# Patient Record
Sex: Male | Born: 1990 | Race: White | Hispanic: No | Marital: Married | Smoking: Never smoker
Health system: Southern US, Community
[De-identification: ages and names within clinical notes are randomized; demographics above are authoritative.]

## PROBLEM LIST (undated history)

## (undated) DIAGNOSIS — E781 Pure hyperglyceridemia: Secondary | ICD-10-CM

## (undated) DIAGNOSIS — E039 Hypothyroidism, unspecified: Secondary | ICD-10-CM

## (undated) HISTORY — PX: ADENOIDECTOMY: SUR15

## (undated) HISTORY — PX: WISDOM TOOTH EXTRACTION: SHX21

## (undated) HISTORY — PX: UVULECTOMY: SHX2631

## (undated) HISTORY — DX: Pure hyperglyceridemia: E78.1

## (undated) HISTORY — PX: TONSILLECTOMY: SUR1361

## (undated) HISTORY — DX: Hypothyroidism, unspecified: E03.9

---
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
## 2009-07-03 ENCOUNTER — Emergency Department (HOSPITAL_COMMUNITY): Admission: EM | Admit: 2009-07-03 | Discharge: 2009-07-03 | Payer: Self-pay | Admitting: Emergency Medicine

## 2010-11-05 ENCOUNTER — Emergency Department (HOSPITAL_COMMUNITY): Payer: No Typology Code available for payment source

## 2010-11-05 ENCOUNTER — Emergency Department (HOSPITAL_COMMUNITY)
Admission: EM | Admit: 2010-11-05 | Discharge: 2010-11-05 | Disposition: A | Discharge status: Home / Self Care | Payer: No Typology Code available for payment source | Attending: Emergency Medicine | Admitting: Emergency Medicine

## 2010-11-05 DIAGNOSIS — M79609 Pain in unspecified limb: Secondary | ICD-10-CM | POA: Insufficient documentation

## 2010-11-05 DIAGNOSIS — R209 Unspecified disturbances of skin sensation: Secondary | ICD-10-CM | POA: Insufficient documentation

## 2010-11-05 DIAGNOSIS — M542 Cervicalgia: Secondary | ICD-10-CM | POA: Insufficient documentation

## 2010-11-05 DIAGNOSIS — R079 Chest pain, unspecified: Secondary | ICD-10-CM | POA: Insufficient documentation

## 2013-06-30 ENCOUNTER — Emergency Department (HOSPITAL_COMMUNITY)
Admission: EM | Admit: 2013-06-30 | Discharge: 2013-06-30 | Disposition: A | Discharge status: Home / Self Care | Payer: BC Managed Care – PPO | Attending: Emergency Medicine | Admitting: Emergency Medicine

## 2013-06-30 ENCOUNTER — Encounter (HOSPITAL_COMMUNITY): Payer: Self-pay | Admitting: Emergency Medicine

## 2013-06-30 DIAGNOSIS — M549 Dorsalgia, unspecified: Secondary | ICD-10-CM | POA: Insufficient documentation

## 2013-06-30 DIAGNOSIS — Z79899 Other long term (current) drug therapy: Secondary | ICD-10-CM | POA: Insufficient documentation

## 2013-06-30 MED ORDER — OXYCODONE-ACETAMINOPHEN 5-325 MG PO TABS: 2.0000 | ORAL_TABLET | ORAL | Status: DC | PRN

## 2013-06-30 MED ORDER — METHOCARBAMOL 500 MG PO TABS: 1000.0000 mg | ORAL_TABLET | Freq: Three times a day (TID) | ORAL | Status: DC

## 2013-06-30 MED ORDER — NAPROXEN 500 MG PO TABS
500.0000 mg | ORAL_TABLET | Freq: Two times a day (BID) | ORAL | Status: DC
  Administered 2013-06-30: 500 mg via ORAL
  Filled 2013-06-30: qty 1
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
## 2014-08-07 ENCOUNTER — Ambulatory Visit (INDEPENDENT_AMBULATORY_CARE_PROVIDER_SITE_OTHER): Payer: BC Managed Care – PPO | Admitting: Family

## 2014-08-07 ENCOUNTER — Encounter: Payer: Self-pay | Admitting: Family

## 2014-08-07 ENCOUNTER — Other Ambulatory Visit (INDEPENDENT_AMBULATORY_CARE_PROVIDER_SITE_OTHER): Payer: BC Managed Care – PPO

## 2014-08-07 VITALS — BP 138/88 | HR 84 | Temp 97.6°F | Resp 18 | Ht 71.0 in | Wt 223.4 lb

## 2014-08-07 DIAGNOSIS — E7800 Pure hypercholesterolemia, unspecified: Secondary | ICD-10-CM

## 2014-08-07 DIAGNOSIS — R0683 Snoring: Secondary | ICD-10-CM | POA: Insufficient documentation

## 2014-08-07 DIAGNOSIS — E781 Pure hyperglyceridemia: Secondary | ICD-10-CM

## 2014-08-07 HISTORY — DX: Pure hyperglyceridemia: E78.1

## 2014-08-07 LAB — LIPID PANEL
CHOL/HDL RATIO: 9
CHOLESTEROL: 155 mg/dL (ref 0–200)
HDL: 16.6 mg/dL — AB (ref >39.00)
NONHDL: 138.4
TRIGLYCERIDES: 744 mg/dL — AB (ref 0.0–149.0)
VLDL: 148.8 mg/dL — AB (ref 0.0–40.0)

## 2014-08-07 LAB — LDL CHOLESTEROL, DIRECT: LDL DIRECT: 41.8 mg/dL

## 2014-08-07 NOTE — Progress Notes (Signed)
Pre visit review using our clinic review tool, if applicable. No additional management support is needed unless otherwise documented below in the visit note. 

## 2014-08-07 NOTE — Patient Instructions (Addendum)
Thank you for choosing [facility].  Summary/Instructions:  Your prescription(s) have been submitted to your pharmacy. Please take as directed and contact our office if you believe you are having problem(s) with the medication(s).  Please stop by the lab on the basement level of the building for your blood work. Your results will be released to MyChart (or called to you) after review, usually within 72 hours after test completion. If any changes need to be made, you will be notified at that same time.  Sleep Apnea  Sleep apnea is a sleep disorder characterized by abnormal pauses in breathing while you sleep. When your breathing pauses, the level of oxygen in your blood decreases. This causes you to move out of deep sleep and into light sleep. As a result, your quality of sleep is poor, and the system that carries your blood throughout your body (cardiovascular system) experiences stress. If sleep apnea remains untreated, the following conditions can develop:  High blood pressure (hypertension).  Coronary artery disease.  Inability to achieve or maintain an erection (impotence).  Impairment of your thought process (cognitive dysfunction). There are three types of sleep apnea: 1. Obstructive sleep apnea--Pauses in breathing during sleep because of a blocked airway. 2. Central sleep apnea--Pauses in breathing during sleep because the area of the brain that controls your breathing does not send the correct signals to the muscles that control breathing. 3. Mixed sleep apnea--A combination of both obstructive and central sleep apnea. RISK FACTORS The following risk factors can increase your risk of developing sleep apnea:  Being overweight.  Smoking.  Having narrow passages in your nose and throat.  Being of older age.  Being male.  Alcohol use.  Sedative and tranquilizer use.  Ethnicity. Among individuals younger than 35 years, African Americans are at increased risk of sleep  apnea. SYMPTOMS   Difficulty staying asleep.  Daytime sleepiness and fatigue.  Loss of energy.  Irritability.  Loud, heavy snoring.  Morning headaches.  Trouble concentrating.  Forgetfulness.  Decreased interest in sex. DIAGNOSIS  In order to diagnose sleep apnea, your caregiver will perform a physical examination. Your caregiver may suggest that you take a home sleep test. Your caregiver may also recommend that you spend the night in a sleep lab. In the sleep lab, several monitors record information about your heart, lungs, and brain while you sleep. Your leg and arm movements and blood oxygen level are also recorded. TREATMENT The following actions may help to resolve mild sleep apnea:  Sleeping on your side.   Using a decongestant if you have nasal congestion.   Avoiding the use of depressants, including alcohol, sedatives, and narcotics.   Losing weight and modifying your diet if you are overweight. There also are devices and treatments to help open your airway:  Oral appliances. These are custom-made mouthpieces that shift your lower jaw forward and slightly open your bite. This opens your airway.  Devices that create positive airway pressure. This positive pressure "splints" your airway open to help you breathe better during sleep. The following devices create positive airway pressure:  Continuous positive airway pressure (CPAP) device. The CPAP device creates a continuous level of air pressure with an air pump. The air is delivered to your airway through a mask while you sleep. This continuous pressure keeps your airway open.  Nasal expiratory positive airway pressure (EPAP) device. The EPAP device creates positive air pressure as you exhale. The device consists of single-use valves, which are inserted into each nostril and  held in place by adhesive. The valves create very little resistance when you inhale but create much more resistance when you exhale. That  increased resistance creates the positive airway pressure. This positive pressure while you exhale keeps your airway open, making it easier to breath when you inhale again.  Bilevel positive airway pressure (BPAP) device. The BPAP device is used mainly in patients with central sleep apnea. This device is similar to the CPAP device because it also uses an air pump to deliver continuous air pressure through a mask. However, with the BPAP machine, the pressure is set at two different levels. The pressure when you exhale is lower than the pressure when you inhale.  Surgery. Typically, surgery is only done if you cannot comply with less invasive treatments or if the less invasive treatments do not improve your condition. Surgery involves removing excess tissue in your airway to create a wider passage way. Document Released: 07/25/2002 Document Revised: 11/29/2012 Document Reviewed: 12/11/2011 [facility] Patient Information 2015 [employer] [place]. This information is not intended to replace advice given to you by your health care provider. Make sure you discuss any questions you have with your health care provider.

## 2014-08-07 NOTE — Progress Notes (Signed)
   Subjective:    Patient ID: Gerald Watts, male    DOB: 09/23/1990, 23 y.o.   MRN: [id number]  Chief Complaint  Patient presents with  . Establish Care    Discuss cholesterol and snoring   HPI:  Gerald Watts is a 23 y.o. male who presents today to establish care and discuss his cholesterol and snoring.   1) High cholesterol - recently tested by his insurance company and was told the results of his cholesterol was abnormally high. Has never had any previous high cholesterol readings.  2) Snoring - has been told that he has been snoring for about year or two and there have been a couple of occasions that he has woken himself up. Has never done a sleep study. Indicates that he does experience some fatigue and tiredness during the day.   No Known Allergies  No current outpatient prescriptions on file prior to visit.   No current facility-administered medications on file prior to visit.   No past medical history on file.  History   Social History  . Marital Status: Single    Spouse Name: N/A    Number of Children: N/A  . Years of Education: 16   Occupational History  . Property Manager    Social History Main Topics  . Smoking status: Never Smoker   . Smokeless tobacco: Never Used  . Alcohol Use: Yes     Comment: Occasional  . Drug Use: No  . Sexual Activity: Not on file   Other Topics Concern  . Not on file   Social History Narrative   [place] Currently resides in an apartment with roommate. 2 dogs. Fun: Sleep, play violin, would like to skydive   Getting married in May 2016   Denies any religious beliefs that would effect healthcare.     Review of Systems    See HPI   Objective:    BP 138/88 mmHg  Pulse 84  Temp(Src) 97.6 F (36.4 C) (Oral)  Resp 18  Ht 5\' 11"  (1.803 m)  Wt 223 lb 6.4 oz (101.334 kg)  BMI 31.17 kg/m2  SpO2 95%  Nursing note and vital signs reviewed.  Physical Exam  Constitutional: He is oriented to person,  place, and time. He appears well-developed and well-nourished. No distress.  Cardiovascular: Normal rate, regular rhythm, normal heart sounds and intact distal pulses.   Pulmonary/Chest: Effort normal and breath sounds normal.  Neurological: He is alert and oriented to person, place, and time.  Skin: Skin is warm and dry.  Psychiatric: He has a normal mood and affect. His behavior is normal. Judgment and thought content normal.       Assessment & Plan:

## 2014-08-07 NOTE — Assessment & Plan Note (Signed)
Indicates that he has been snoring and has noticed that his energy levels have decreased and he is feeling more fatigued. Refer for nocturnal polysomnograph. Discussed with patient use of nasal strips and losing 5-10% of body weight which may help with the snoring. Follow up pending results.

## 2014-08-07 NOTE — Assessment & Plan Note (Signed)
Cholesterol was previously checked by his insurance company and he was instructed that he needed to see his primary care physician because the results are abnormal. Obtain lipid profile. Plan pending results.

## 2014-08-08 ENCOUNTER — Encounter: Payer: Self-pay | Admitting: Family

## 2014-08-08 ENCOUNTER — Telehealth: Payer: Self-pay | Admitting: Family

## 2014-08-08 NOTE — Telephone Encounter (Signed)
Pt request to get tetanus/tdap shot. Please advise.

## 2014-08-08 NOTE — Telephone Encounter (Signed)
A tetanus shot can be done at any time. And I would wait a couple of weeks, so perhaps in mid-January.

## 2014-08-08 NOTE — Telephone Encounter (Signed)
Pt also would like to know when Gerald Watts want him do get another cholesterol done again since the result was in valid last time. Please advise.

## 2014-08-09 ENCOUNTER — Other Ambulatory Visit: Payer: Self-pay | Admitting: Family

## 2014-08-09 DIAGNOSIS — E781 Pure hyperglyceridemia: Secondary | ICD-10-CM

## 2014-08-09 NOTE — Telephone Encounter (Signed)
Pt inform through my chart

## 2014-08-22 ENCOUNTER — Ambulatory Visit: Payer: BC Managed Care – PPO

## 2014-09-18 ENCOUNTER — Encounter: Payer: Self-pay | Admitting: Family

## 2014-09-18 ENCOUNTER — Other Ambulatory Visit (INDEPENDENT_AMBULATORY_CARE_PROVIDER_SITE_OTHER): Payer: 59

## 2014-09-18 ENCOUNTER — Other Ambulatory Visit: Payer: Self-pay | Admitting: Family

## 2014-09-18 ENCOUNTER — Ambulatory Visit (INDEPENDENT_AMBULATORY_CARE_PROVIDER_SITE_OTHER): Payer: 59 | Admitting: Family

## 2014-09-18 VITALS — BP 134/88 | HR 89 | Temp 98.0°F | Resp 18 | Ht 71.0 in | Wt 231.4 lb

## 2014-09-18 DIAGNOSIS — R7989 Other specified abnormal findings of blood chemistry: Secondary | ICD-10-CM

## 2014-09-18 DIAGNOSIS — Z0000 Encounter for general adult medical examination without abnormal findings: Secondary | ICD-10-CM

## 2014-09-18 DIAGNOSIS — Z23 Encounter for immunization: Secondary | ICD-10-CM

## 2014-09-18 LAB — BASIC METABOLIC PANEL
BUN: 8 mg/dL (ref 6–23)
CO2: 25 mEq/L (ref 19–32)
CREATININE: 0.9 mg/dL (ref 0.40–1.50)
Calcium: 9.8 mg/dL (ref 8.4–10.5)
Chloride: 104 mEq/L (ref 96–112)
GFR: 109.73 mL/min (ref >60.00)
GLUCOSE: 93 mg/dL (ref 70–99)
POTASSIUM: 4.1 meq/L (ref 3.5–5.1)
Sodium: 139 mEq/L (ref 135–145)

## 2014-09-18 LAB — CBC
HCT: 43.7 % (ref 39.0–52.0)
Hemoglobin: 15.6 g/dL (ref 13.0–17.0)
MCHC: 35.8 g/dL (ref 30.0–36.0)
MCV: 89.9 fl (ref 78.0–100.0)
PLATELETS: 239 10*3/uL (ref 150.0–400.0)
RBC: 4.86 Mil/uL (ref 4.22–5.81)
RDW: 12.4 % (ref 11.5–15.5)
WBC: 6.3 10*3/uL (ref 4.0–10.5)

## 2014-09-18 LAB — TSH: TSH: 4.71 u[IU]/mL — ABNORMAL HIGH (ref 0.35–4.50)

## 2014-09-18 MED ORDER — PHENTERMINE HCL 37.5 MG PO TABS: 37.5000 mg | ORAL_TABLET | Freq: Every day | ORAL | Status: DC

## 2014-09-18 NOTE — Progress Notes (Signed)
Pre visit review using our clinic review tool, if applicable. No additional management support is needed unless otherwise documented below in the visit note. 

## 2014-09-18 NOTE — Addendum Note (Signed)
Addended by: Mercer PodWRENN, Idil Maslanka E on: 09/18/2014 02:41 PM   Modules accepted: Orders

## 2014-09-18 NOTE — Assessment & Plan Note (Addendum)
1) Anticipatory Guidance: Discussed importance of wearing a seatbelt while driving and not texting while driving; changing batteries in smoke detector at least once annually; wearing suntan lotion when outside; eating a balanced and moderate diet; getting physical activity at least 30 minutes per day.  2) Immunizations / Screenings / Labs:  Tetanus shot given today. All other immunizations are up-to-date per recommendations. Due for dental exam. All otherscreenings are up-to-date per recommendations. Obtain CBC, BMET, and TSH.    Overall well exam. Discussed risk factors including elevated blood pressure and obesity. Patient indicates a sedentary lifestyle. The goal is to increase his physical activity to 30 minutes most days of the week. He will also work on increasing the nutrient density of his diet as well as starting a Fish supplement for his lower HDL level. He also requests to start phentermine as a trial. Discussed the risks and benefits will start phentermine 1 month. Follow up prevention exam in one year.

## 2014-09-18 NOTE — Progress Notes (Signed)
Subjective:    Patient ID: Gerald Watts, male    DOB: 09/10/1990, 24 y.o.   MRN: [id number]   Chief Complaint  Patient presents with  . CPE    Not fasting    HPI:  Gerald Watts is a 24 y.o. male who presents today for an annual wellness visit.   1) Health Maintenance - Overall feels ok, but feels overweight. Preparing to get married.  Diet - Eating 3 meals per day; more fruits than vegetables; heavy in the carbohydrate area; about 1 cup of caffeine per day; eats a lot of processed food  Exercise - Little to none   2) Preventative Exams / Immunizations:  Dental -- Due for exam Vision -- Up to date   Health Maintenance  Topic Date Due  . TETANUS/TDAP  03/22/2009  . INFLUENZA VACCINE  03/18/2014  Tetanus    There is no immunization history on file for this patient.   No Known Allergies  No current outpatient prescriptions on file prior to visit.   No current facility-administered medications on file prior to visit.    History reviewed. No pertinent past medical history.  Past Surgical History  Procedure Laterality Date  . Wisdom tooth extraction      Family History  Problem Relation Age of Onset  . Drug abuse Mother     Prescription medications  . Hypertension Mother     History   Social History  . Marital Status: Single    Spouse Name: N/A    Number of Children: N/A  . Years of Education: 16   Occupational History  . Property Manager    Social History Main Topics  . Smoking status: Never Smoker   . Smokeless tobacco: Never Used  . Alcohol Use: Yes     Comment: Occasional  . Drug Use: No  . Sexual Activity: Not on file   Other Topics Concern  . Not on file   Social History Narrative   Born and raised [place], [place]. Currently resides in an apartment with roommate. 2 dogs. Fun: Sleep, play violin, would like to skydive   Getting married in May 2016   Denies any religious beliefs that would effect healthcare.     Review of  Systems  Constitutional: Denies fever, chills, fatigue, or significant weight gain/loss. HENT: Head: Denies headache or neck pain Ears: Denies changes in hearing, ringing in ears, earache, drainage Nose: Denies discharge, stuffiness, itching, nosebleed, sinus pain Throat: Denies sore throat, hoarseness, dry mouth, sores, thrush Eyes: Denies loss/changes in vision, pain, redness, blurry/double vision, flashing lights Cardiovascular: Denies chest pain/discomfort, tightness, palpitations, shortness of breath with activity, difficulty lying down, swelling, sudden awakening with shortness of breath Respiratory: Denies shortness of breath, cough, sputum production, wheezing Gastrointestinal: Denies dysphasia, heartburn, change in appetite, nausea, change in bowel habits, rectal bleeding, constipation, diarrhea, yellow skin or eyes Genitourinary: Denies frequency, urgency, burning/pain, blood in urine, incontinence, change in urinary strength. Musculoskeletal: Denies muscle/joint pain, stiffness, back pain, redness or swelling of joints, trauma Skin: Denies rashes, lumps, itching, dryness, color changes, or hair/nail changes Neurological: Denies dizziness, fainting, seizures, weakness, numbness, tingling, tremor Psychiatric - Denies nervousness, stress, depression or memory loss Endocrine: Denies heat or cold intolerance, sweating, frequent urination, excessive thirst, changes in appetite Hematologic: Denies ease of bruising or bleeding     Objective:    BP 134/88 mmHg  Pulse 89  Temp(Src) 98 F (36.7 C) (Oral)  Resp 18  Ht  (1.803 m)  Wt 231 lb 6.4 oz (104.962 kg)  BMI 32.29 kg/m2  SpO2 97% Nursing note and vital signs reviewed.  Physical Exam  Constitutional: He is oriented to person, place, and time. He appears well-developed and well-nourished.  HENT:  Head: Normocephalic.  Right Ear: Hearing, tympanic membrane, external ear and ear canal normal.  Left Ear: Hearing, tympanic  membrane, external ear and ear canal normal.  Nose: Nose normal.  Mouth/Throat: Uvula is midline, oropharynx is clear and moist and mucous membranes are normal.  Eyes: Conjunctivae and EOM are normal. Pupils are equal, round, and reactive to light.  Neck: Neck supple. No JVD present. No tracheal deviation present. No thyromegaly present.  Cardiovascular: Normal rate, regular rhythm, normal heart sounds and intact distal pulses.   Pulmonary/Chest: Effort normal and breath sounds normal.  Abdominal: Soft. Bowel sounds are normal. He exhibits no distension and no mass. There is no tenderness. There is no rebound and no guarding.  Musculoskeletal: Normal range of motion. He exhibits no edema or tenderness.  Lymphadenopathy:    He has no cervical adenopathy.  Neurological: He is alert and oriented to person, place, and time. He has normal reflexes. No cranial nerve deficit. He exhibits normal muscle tone. Coordination normal.  Skin: Skin is warm and dry.  Psychiatric: He has a normal mood and affect. His behavior is normal. Judgment and thought content normal.       Assessment & Plan:

## 2014-09-18 NOTE — Patient Instructions (Addendum)
Thank you for choosing [facility].  Summary/Instructions:  Please stop by the lab on the basement level of the building for your blood work. Your results will be released to MyChart (or called to you) after review, usually within 72 hours after test completion. If any changes need to be made, you will be notified at that same time.  Referrals have been made during this visit. You should expect to hear back from our schedulers in about 7-10 days in regards to establishing an appointment with the specialists we discussed.    Health Maintenance A healthy lifestyle and preventative care can promote health and wellness.  Maintain regular health, dental, and eye exams.  Eat a healthy diet. Foods like vegetables, fruits, whole grains, low-fat dairy products, and lean protein foods contain the nutrients you need and are low in calories. Decrease your intake of foods high in solid fats, added sugars, and salt. Get information about a proper diet from your health care provider, if necessary.  Regular physical exercise is one of the most important things you can do for your health. Most adults should get at least 150 minutes of moderate-intensity exercise (any activity that increases your heart rate and causes you to sweat) each week. In addition, most adults need muscle-strengthening exercises on 2 or more days a week.   Maintain a healthy weight. The body mass index (BMI) is a screening tool to identify possible weight problems. It provides an estimate of body fat based on height and weight. Your health care provider can find your BMI and can help you achieve or maintain a healthy weight. For males 20 years and older:  A BMI below 18.5 is considered underweight.  A BMI of 18.5 to 24.9 is normal.  A BMI of 25 to 29.9 is considered overweight.  A BMI of 30 and above is considered obese.  Maintain normal blood lipids and cholesterol by exercising and minimizing your intake of saturated fat.  Eat a balanced diet with plenty of fruits and vegetables. Blood tests for lipids and cholesterol should begin at age 20 and be repeated every 5 years. If your lipid or cholesterol levels are high, you are over age 50, or you are at high risk for heart disease, you may need your cholesterol levels checked more frequently.Ongoing high lipid and cholesterol levels should be treated with medicines if diet and exercise are not working.  If you smoke, find out from your health care provider how to quit. If you do not use tobacco, do not start.  Lung cancer screening is recommended for adults aged 55-80 years who are at high risk for developing lung cancer because of a history of smoking. A yearly low-dose CT scan of the lungs is recommended for people who have at least a 30-pack-year history of smoking and are current smokers or have quit within the past 15 years. A pack year of smoking is smoking an average of 1 pack of cigarettes a day for 1 year (for example, a 30-pack-year history of smoking could mean smoking 1 pack a day for 30 years or 2 packs a day for 15 years). Yearly screening should continue until the smoker has stopped smoking for at least 15 years. Yearly screening should be stopped for people who develop a health problem that would prevent them from having lung cancer treatment.  If you choose to drink alcohol, do not have more than 2 drinks per day. One drink is considered to be 12 oz (360 mL) of   beer, 5 oz (150 mL) of wine, or 1.5 oz (45 mL) of liquor.  Avoid the use of street drugs. Do not share needles with anyone. Ask for help if you need support or instructions about stopping the use of drugs.  High blood pressure causes heart disease and increases the risk of stroke. Blood pressure should be checked at least every 1-2 years. Ongoing high blood pressure should be treated with medicines if weight loss and exercise are not effective.  If you are 45-79 years old, ask your health care  provider if you should take aspirin to prevent heart disease.  Diabetes screening involves taking a blood sample to check your fasting blood sugar level. This should be done once every 3 years after age 45 if you are at a normal weight and without risk factors for diabetes. Testing should be considered at a younger age or be carried out more frequently if you are overweight and have at least 1 risk factor for diabetes.  Colorectal cancer can be detected and often prevented. Most routine colorectal cancer screening begins at the age of 50 and continues through age 75. However, your health care provider may recommend screening at an earlier age if you have risk factors for colon cancer. On a yearly basis, your health care provider may provide home test kits to check for hidden blood in the stool. A small camera at the end of a tube may be used to directly examine the colon (sigmoidoscopy or colonoscopy) to detect the earliest forms of colorectal cancer. Talk to your health care provider about this at age 50 when routine screening begins. A direct exam of the colon should be repeated every 5-10 years through age 75, unless early forms of precancerous polyps or small growths are found.  People who are at an increased risk for hepatitis B should be screened for this virus. You are considered at high risk for hepatitis B if:  You were born in a country where hepatitis B occurs often. Talk with your health care provider about which countries are considered high risk.  Your parents were born in a high-risk country and you have not received a shot to protect against hepatitis B (hepatitis B vaccine).  You have HIV or AIDS.  You use needles to inject street drugs.  You live with, or have sex with, someone who has hepatitis B.  You are a man who has sex with other men (MSM).  You get hemodialysis treatment.  You take certain medicines for conditions like cancer, organ transplantation, and autoimmune  conditions.  Hepatitis C blood testing is recommended for all people born from 1945 through 1965 and any individual with known risk factors for hepatitis C.  Healthy men should no longer receive prostate-specific antigen (PSA) blood tests as part of routine cancer screening. Talk to your health care provider about prostate cancer screening.  Testicular cancer screening is not recommended for adolescents or adult males who have no symptoms. Screening includes self-exam, a health care provider exam, and other screening tests. Consult with your health care provider about any symptoms you have or any concerns you have about testicular cancer.  Practice safe sex. Use condoms and avoid high-risk sexual practices to reduce the spread of sexually transmitted infections (STIs).  You should be screened for STIs, including gonorrhea and chlamydia if:  You are sexually active and are younger than 24 years.  You are older than 24 years, and your health care provider tells you that you are   at risk for this type of infection.  Your sexual activity has changed since you were last screened, and you are at an increased risk for chlamydia or gonorrhea. Ask your health care provider if you are at risk.  If you are at risk of being infected with HIV, it is recommended that you take a prescription medicine daily to prevent HIV infection. This is called pre-exposure prophylaxis (PrEP). You are considered at risk if:  You are a man who has sex with other men (MSM).  You are a heterosexual man who is sexually active with multiple partners.  You take drugs by injection.  You are sexually active with a partner who has HIV.  Talk with your health care provider about whether you are at high risk of being infected with HIV. If you choose to begin PrEP, you should first be tested for HIV. You should then be tested every 3 months for as long as you are taking PrEP.  Use sunscreen. Apply sunscreen liberally and  repeatedly throughout the day. You should seek shade when your shadow is shorter than you. Protect yourself by wearing long sleeves, pants, a wide-brimmed hat, and sunglasses year round whenever you are outdoors.  Tell your health care provider of new moles or changes in moles, especially if there is a change in shape or color. Also, tell your health care provider if a mole is larger than the size of a pencil eraser.  A one-time screening for abdominal aortic aneurysm (AAA) and surgical repair of large AAAs by ultrasound is recommended for men aged 65-75 years who are current or former smokers.  Stay current with your vaccines (immunizations). Document Released: 01/31/2008 Document Revised: 08/09/2013 Document Reviewed: 12/30/2010 [employer] Patient Information 2015 [employer]. This information is not intended to replace advice given to you by your health care provider. Make sure you discuss any questions you have with your health care provider.   

## 2014-09-20 ENCOUNTER — Encounter: Payer: Self-pay | Admitting: Family

## 2014-09-26 ENCOUNTER — Encounter: Payer: Self-pay | Admitting: Family

## 2014-10-17 ENCOUNTER — Ambulatory Visit: Payer: 59 | Admitting: Family

## 2014-10-27 ENCOUNTER — Ambulatory Visit: Payer: 59 | Admitting: Family

## 2014-10-30 ENCOUNTER — Ambulatory Visit: Payer: 59 | Admitting: Family

## 2014-10-31 ENCOUNTER — Ambulatory Visit (INDEPENDENT_AMBULATORY_CARE_PROVIDER_SITE_OTHER): Payer: BLUE CROSS/BLUE SHIELD | Admitting: Family

## 2014-10-31 ENCOUNTER — Encounter: Payer: Self-pay | Admitting: Family

## 2014-10-31 VITALS — BP 132/90 | HR 99 | Temp 98.3°F | Resp 18 | Ht 71.0 in | Wt 216.8 lb

## 2014-10-31 DIAGNOSIS — R5383 Other fatigue: Secondary | ICD-10-CM | POA: Insufficient documentation

## 2014-10-31 DIAGNOSIS — E669 Obesity, unspecified: Secondary | ICD-10-CM

## 2014-10-31 MED ORDER — PHENTERMINE HCL 37.5 MG PO TABS: 37.5000 mg | ORAL_TABLET | Freq: Every day | ORAL | Status: DC

## 2014-10-31 NOTE — Patient Instructions (Signed)
Thank you for choosing [facility].  Summary/Instructions:  Your prescription(s) have been submitted to your pharmacy or been printed and provided for you. Please take as directed and contact our office if you believe you are having problem(s) with the medication(s) or have any questions.  Please stop by the lab on the basement level of the building for your blood work. Your results will be released to MyChart (or called to you) after review, usually within 72 hours after test completion. If any changes need to be made, you will be notified at that same time.  If your symptoms worsen or fail to improve, please contact our office for further instruction, or in case of emergency go directly to the emergency room at the closest medical facility.     

## 2014-10-31 NOTE — Progress Notes (Signed)
Pre visit review using our clinic review tool, if applicable. No additional management support is needed unless otherwise documented below in the visit note. 

## 2014-10-31 NOTE — Assessment & Plan Note (Signed)
Patient has experience a weight loss of 13 pounds since previous visit. Denies any adverse effects of the phentermine. Would like to continue another month. Refill phentermine at current dosage. Follow-up in one month.

## 2014-10-31 NOTE — Assessment & Plan Note (Signed)
Patient continues to experience idiopathic fatigue. Previous blood work reveals no evidence of anemia, TSH was slightly elevated at 4.7. Obtain testosterone. Cannot rule out cardiovascular disease, anxiety, or depression. Follow-up pending testosterone results.

## 2014-10-31 NOTE — Progress Notes (Signed)
   Subjective:    Patient ID: Gerald Watts, male    DOB: 02/03/1991, 24 y.o.   MRN: [id number]  Chief Complaint  Patient presents with  . Follow-up    needs refill of phentermine, has lost 13lbs in a month, but also states he gets to where he has no energy and feels like he is dragging.     HPI:  Gerald Watts is a 24 y.o. male who presents today for follow up.  Was recently started on phentermine for weight loss and indicates that he has experienced the associated symptom of  13 pounds of weight loss since starting the medication. He is maintaining a mainly vegetarian type diet with occasional meat. Denies any adverse effects of medication.  Does not some continued fatigue.    Wt Readings from Last 3 Encounters:  10/31/14 216 lb 12.8 oz (98.34 kg)  09/18/14 231 lb 6.4 oz (104.962 kg)  08/07/14 223 lb 6.4 oz (101.334 kg)    No Known Allergies   Current Outpatient Prescriptions on File Prior to Visit  Medication Sig Dispense Refill  . phentermine (ADIPEX-P) 37.5 MG tablet Take 1 tablet (37.5 mg total) by mouth daily before breakfast. 30 tablet 0   No current facility-administered medications on file prior to visit.    Review of Systems  Constitutional: Positive for fatigue.  Respiratory: Negative for chest tightness and shortness of breath.   Cardiovascular: Negative for chest pain, palpitations and leg swelling.  Neurological: Negative for headaches.      Objective:    BP 132/90 mmHg  Pulse 99  Temp(Src) 98.3 F (36.8 C) (Oral)  Resp 18  Ht 5\' 11"  (1.803 m)  Wt 216 lb 12.8 oz (98.34 kg)  BMI 30.25 kg/m2  SpO2 97% Nursing note and vital signs reviewed.  Physical Exam  Constitutional: He is oriented to person, place, and time. He appears well-developed and well-nourished. No distress.  Cardiovascular: Normal rate, regular rhythm, normal heart sounds and intact distal pulses.   Pulmonary/Chest: Effort normal and breath sounds normal.  Neurological: He is  alert and oriented to person, place, and time.  Skin: Skin is warm and dry.  Psychiatric: He has a normal mood and affect. His behavior is normal. Judgment and thought content normal.       Assessment & Plan:

## 2014-11-30 ENCOUNTER — Telehealth: Payer: Self-pay | Admitting: Family

## 2014-11-30 DIAGNOSIS — R5383 Other fatigue: Secondary | ICD-10-CM

## 2014-12-05 MED ORDER — PHENTERMINE HCL 37.5 MG PO TABS: 37.5000 mg | ORAL_TABLET | Freq: Every day | ORAL | Status: DC

## 2014-12-05 NOTE — Telephone Encounter (Signed)
Medication refilled

## 2014-12-05 NOTE — Addendum Note (Signed)
Addended by: Jeanine LuzALONE, Yoan Sallade D on: 12/05/2014 01:26 PM   Modules accepted: Orders

## 2014-12-05 NOTE — Telephone Encounter (Addendum)
Medication faxed to pharmacy. LVM for pt

## 2015-03-19 ENCOUNTER — Encounter: Payer: Self-pay | Admitting: Family

## 2015-03-19 ENCOUNTER — Ambulatory Visit (INDEPENDENT_AMBULATORY_CARE_PROVIDER_SITE_OTHER): Payer: BLUE CROSS/BLUE SHIELD | Admitting: Family

## 2015-03-19 ENCOUNTER — Other Ambulatory Visit (INDEPENDENT_AMBULATORY_CARE_PROVIDER_SITE_OTHER): Payer: BLUE CROSS/BLUE SHIELD

## 2015-03-19 VITALS — BP 128/88 | HR 86 | Temp 97.8°F | Resp 18 | Ht 71.0 in | Wt 211.1 lb

## 2015-03-19 DIAGNOSIS — R5383 Other fatigue: Secondary | ICD-10-CM

## 2015-03-19 DIAGNOSIS — E669 Obesity, unspecified: Secondary | ICD-10-CM

## 2015-03-19 DIAGNOSIS — N529 Male erectile dysfunction, unspecified: Secondary | ICD-10-CM | POA: Diagnosis not present

## 2015-03-19 LAB — LIPID PANEL
CHOL/HDL RATIO: 9
CHOLESTEROL: 198 mg/dL (ref 0–200)
HDL: 21 mg/dL — ABNORMAL LOW (ref >39.00)

## 2015-03-19 LAB — TSH: TSH: 4.15 u[IU]/mL (ref 0.35–4.50)

## 2015-03-19 LAB — TESTOSTERONE: TESTOSTERONE: 368.87 ng/dL (ref 300.00–890.00)

## 2015-03-19 LAB — LDL CHOLESTEROL, DIRECT: Direct LDL: 67 mg/dL

## 2015-03-19 LAB — T3: T3, Total: 134.1 ng/dL (ref 80.0–204.0)

## 2015-03-19 LAB — T4: T4 TOTAL: 7.2 ug/dL (ref 4.5–12.0)

## 2015-03-19 MED ORDER — SILDENAFIL CITRATE 50 MG PO TABS: 50.0000 mg | ORAL_TABLET | Freq: Every day | ORAL | Status: DC | PRN

## 2015-03-19 MED ORDER — FENOFIBRATE 145 MG PO TABS: 145.0000 mg | ORAL_TABLET | Freq: Every day | ORAL | Status: DC

## 2015-03-19 MED ORDER — LEVOTHYROXINE SODIUM 25 MCG PO TABS: 25.0000 ug | ORAL_TABLET | Freq: Every day | ORAL | Status: DC

## 2015-03-19 NOTE — Assessment & Plan Note (Signed)
Continues to experience associated symptom of fatigue. Was previously noted to have increased TSH, however never completed the blood work. Obtain TSH, T3, and T4 to recheck thyroid. Obtain lipid profile and testosterone to rule out other metabolic causes. Follow-up and treatment pending lab work.

## 2015-03-19 NOTE — Patient Instructions (Addendum)
Thank you for choosing [facility].  Summary/Instructions:  Your prescription(s) have been submitted to your pharmacy or been printed and provided for you. Please take as directed and contact our office if you believe you are having problem(s) with the medication(s) or have any questions.  If your symptoms worsen or fail to improve, please contact our office for further instruction, or in case of emergency go directly to the emergency room at the closest medical facility.    Fatigue Fatigue is a feeling of tiredness, lack of energy, lack of motivation, or feeling tired all the time. Having enough rest, good nutrition, and reducing stress will normally reduce fatigue. Consult your caregiver if it persists. The nature of your fatigue will help your caregiver to find out its cause. The treatment is based on the cause.  CAUSES  There are many causes for fatigue. Most of the time, fatigue can be traced to one or more of your habits or routines. Most causes fit into one or more of three general areas. They are: Lifestyle problems  Sleep disturbances.  Overwork.  Physical exertion.  Unhealthy habits.  Poor eating habits or eating disorders.  Alcohol and/or drug use .  Lack of proper nutrition (malnutrition). Psychological problems  Stress and/or anxiety problems.  Depression.  Grief.  Boredom. Medical Problems or Conditions  Anemia.  Pregnancy.  Thyroid gland problems.  Recovery from major surgery.  Continuous pain.  Emphysema or asthma that is not well controlled  Allergic conditions.  Diabetes.  Infections (such as mononucleosis).  Obesity.  Sleep disorders, such as sleep apnea.  Heart failure or other heart-related problems.  Cancer.  Kidney disease.  Liver disease.  Effects of certain medicines such as antihistamines, cough and cold remedies, prescription pain medicines, heart and blood pressure medicines, drugs used for treatment of cancer,  and some antidepressants. SYMPTOMS  The symptoms of fatigue include:   Lack of energy.  Lack of drive (motivation).  Drowsiness.  Feeling of indifference to the surroundings. DIAGNOSIS  The details of how you feel help guide your caregiver in finding out what is causing the fatigue. You will be asked about your present and past health condition. It is important to review all medicines that you take, including prescription and non-prescription items. A thorough exam will be done. You will be questioned about your feelings, habits, and normal lifestyle. Your caregiver may suggest blood tests, urine tests, or other tests to look for common medical causes of fatigue.  TREATMENT  Fatigue is treated by correcting the underlying cause. For example, if you have continuous pain or depression, treating these causes will improve how you feel. Similarly, adjusting the dose of certain medicines will help in reducing fatigue.  HOME CARE INSTRUCTIONS   Try to get the required amount of good sleep every night.  Eat a healthy and nutritious diet, and drink enough water throughout the day.  Practice ways of relaxing (including yoga or meditation).  Exercise regularly.  Make plans to change situations that cause stress. Act on those plans so that stresses decrease over time. Keep your work and personal routine reasonable.  Avoid street drugs and minimize use of alcohol.  Start taking a daily multivitamin after consulting your caregiver. SEEK MEDICAL CARE IF:   You have persistent tiredness, which cannot be accounted for.  You have fever.  You have unintentional weight loss.  You have headaches.  You have disturbed sleep throughout the night.  You are feeling sad.  You have constipation.  You  have dry skin.  You have gained weight.  You are taking any new or different medicines that you suspect are causing fatigue.  You are unable to sleep at night.  You develop any unusual swelling  of your legs or other parts of your body. SEEK IMMEDIATE MEDICAL CARE IF:   You are feeling confused.  Your vision is blurred.  You feel faint or pass out.  You develop severe headache.  You develop severe abdominal, pelvic, or back pain.  You develop chest pain, shortness of breath, or an irregular or fast heartbeat.  You are unable to pass a normal amount of urine.  You develop abnormal bleeding such as bleeding from the rectum or you vomit blood.  You have thoughts about harming yourself or committing suicide.  You are worried that you might harm someone else. MAKE SURE YOU:   Understand these instructions.  Will watch your condition.  Will get help right away if you are not doing well or get worse. Document Released: 06/01/2007 Document Revised: 10/27/2011 Document Reviewed: 12/06/2013 [facility] Patient Information 2015 [place], [place]. This information is not intended to replace advice given to you by your health care provider. Make sure you discuss any questions you have with your health care provider.

## 2015-03-19 NOTE — Assessment & Plan Note (Signed)
Symptoms consistent with erectile dysfunction of undetermined source. Start Viagra. Patient advised of risks and benefits of medication. Follow-up pending usage of medication to determine effectiveness.

## 2015-03-19 NOTE — Progress Notes (Signed)
Pre visit review using our clinic review tool, if applicable. No additional management support is needed unless otherwise documented below in the visit note. 

## 2015-03-19 NOTE — Progress Notes (Signed)
   Subjective:    Patient ID: Gerald Watts, male    DOB: [birth year], 24 y.o.   MRN: [id number]  Chief Complaint  Patient presents with  . Follow-up    wants to talk about the phentermine and has some fertility questions    HPI:  Gerald Watts is a 24 y.o. male with a PMH of snoring, fatigue and elevated cholesterol who presents today an office visit.   1.) Trouble with erections - This is a new problem. Associated symptom of difficulty maintaining an erection has been going on for about 1 month. Able to obtain an erection.  Recently completed a course of phentermine which has the side effect of impotenence and lost 25 pounds with the medication. Denies any modifying factors that make it better or worse. Previously evaluated for fatigue and found to have an increase TSH level. Recently found out his wife was weeks pregnant with their first child.   No Known Allergies   No current outpatient prescriptions on file prior to visit.   No current facility-administered medications on file prior to visit.    Review of Systems  Constitutional: Positive for fatigue. Negative for fever and chills.  Respiratory: Negative for chest tightness and shortness of breath.   Cardiovascular: Negative for chest pain, palpitations and leg swelling.      Objective:    BP 128/88 mmHg  Pulse 86  Temp(Src) 97.8 F (36.6 C) (Oral)  Resp 18  Ht  (1.803 m)  Wt 211 lb 1.9 oz (95.763 kg)  BMI 29.46 kg/m2  SpO2 98% Nursing note and vital signs reviewed.  Physical Exam  Constitutional: He is oriented to person, place, and time. He appears well-developed and well-nourished. No distress.  Cardiovascular: Normal rate, regular rhythm, normal heart sounds and intact distal pulses.   Pulmonary/Chest: Effort normal and breath sounds normal.  Neurological: He is alert and oriented to person, place, and time.  Skin: Skin is warm and dry.  Psychiatric: He has a normal mood and affect. His behavior is  normal. Judgment and thought content normal.       Assessment & Plan:   Problem List Items Addressed This Visit      Genitourinary   Erectile dysfunction    Symptoms consistent with erectile dysfunction of undetermined source. Start Viagra. Patient advised of risks and benefits of medication. Follow-up pending usage of medication to determine effectiveness.      Relevant Medications   sildenafil (VIAGRA) 50 MG tablet     Other   Fatigue - Primary    Continues to experience associated symptom of fatigue. Was previously noted to have increased TSH, however never completed the blood work. Obtain TSH, T3, and T4 to recheck thyroid. Obtain lipid profile and testosterone to rule out other metabolic causes. Follow-up and treatment pending lab work.      Relevant Orders   Testosterone   TSH   T3   T4   Lipid Profile

## 2015-03-26 ENCOUNTER — Telehealth: Payer: Self-pay

## 2015-03-26 DIAGNOSIS — N529 Male erectile dysfunction, unspecified: Secondary | ICD-10-CM

## 2015-03-26 NOTE — Telephone Encounter (Signed)
Received pharmacy rejection stating that insurance will not cover viagra without a prior authorization. Called [facility] [place] at [phone], who fill fax form for completion.

## 2015-03-26 NOTE — Telephone Encounter (Signed)
Form received, completed, pending signature to fax back.

## 2015-03-27 NOTE — Telephone Encounter (Signed)
PA was approved, called pharmacy to let them know. Even through insurance viagra is $300. Is there any other suggestions. Please advise

## 2015-03-27 NOTE — Telephone Encounter (Signed)
Only other suggestion is to go through [facility]. This would be  pills and are $10 per 5 pills. Dose range between 50-100 mg daily.

## 2015-03-28 ENCOUNTER — Encounter: Payer: Self-pay | Admitting: Family

## 2015-03-28 MED ORDER — SILDENAFIL CITRATE 50 MG PO TABS: 50.0000 mg | ORAL_TABLET | Freq: Every day | ORAL | Status: DC | PRN

## 2015-03-28 NOTE — Telephone Encounter (Signed)
LVM for pt to call back.

## 2015-03-28 NOTE — Telephone Encounter (Signed)
Patient states he would like to go through [facility].  He would like to do 10 pills @ $20.00 to start out.  Please call patient if there is anything else he needs to know.

## 2015-03-28 NOTE — Addendum Note (Signed)
Addended by: Mercer Pod E on: 03/28/2015 01:37 PM   Modules accepted: Orders

## 2015-03-28 NOTE — Telephone Encounter (Signed)
[facility] called wanting to save patients money would like to have prescription written  #50 and same directions are fine.

## 2015-03-28 NOTE — Telephone Encounter (Signed)
Rx sent. Pt aware.  

## 2015-03-28 NOTE — Addendum Note (Signed)
Addended by: Mercer Pod E on: 03/28/2015 11:42 AM   Modules accepted: Orders

## 2015-03-28 NOTE — Telephone Encounter (Signed)
Rx sent 

## 2015-03-30 ENCOUNTER — Encounter: Payer: Self-pay | Admitting: Family

## 2015-04-19 ENCOUNTER — Ambulatory Visit (INDEPENDENT_AMBULATORY_CARE_PROVIDER_SITE_OTHER): Payer: BLUE CROSS/BLUE SHIELD | Admitting: Family

## 2015-04-19 ENCOUNTER — Encounter: Payer: Self-pay | Admitting: Family

## 2015-04-19 VITALS — BP 134/88 | HR 73 | Temp 97.4°F | Resp 18 | Ht 71.0 in | Wt 213.0 lb

## 2015-04-19 DIAGNOSIS — E039 Hypothyroidism, unspecified: Secondary | ICD-10-CM | POA: Diagnosis not present

## 2015-04-19 DIAGNOSIS — Z23 Encounter for immunization: Secondary | ICD-10-CM

## 2015-04-19 DIAGNOSIS — B070 Plantar wart: Secondary | ICD-10-CM | POA: Insufficient documentation

## 2015-04-19 HISTORY — DX: Hypothyroidism, unspecified: E03.9

## 2015-04-19 MED ORDER — LEVOTHYROXINE SODIUM 50 MCG PO TABS
25.0000 ug | ORAL_TABLET | Freq: Every day | ORAL | Status: DC
Start: 2015-04-19 — End: 2015-10-15

## 2015-04-19 NOTE — Patient Instructions (Addendum)
Thank you for choosing [facility].  Summary/Instructions:   If your symptoms worsen or fail to improve, please contact our office for further instruction, or in case of emergency go directly to the emergency room at the closest medical facility.   Please increase your levothyroxine. Follow up in 6 weeks for TSH and lipid check.   Plantar Warts Warts are benign (noncancerous) growths of the outer skin layer. They can occur at any time in life but are most common during childhood and the teen years. Warts can occur on many skin surfaces of the body. When they occur on the underside (sole) of your foot they are called plantar warts. They often emerge in groups with several small warts encircling a larger growth. CAUSES  Human papillomavirus (HPV) is the cause of plantar warts. HPV attacks a break in the skin of the foot. Walking barefoot can lead to exposure to the wart virus. Plantar warts tend to develop over areas of pressure such as the heel and ball of the foot. Plantar warts often grow into the deeper layers of skin. They may spread to other areas of the sole but cannot spread to other areas of the body. SYMPTOMS  You may also notice a growth on the undersurface of your foot. The wart may grow directly into the sole of the foot, or rise above the surface of the skin on the sole of the foot, or both. They are most often flat from pressure. Warts generally do not cause itching but may cause pain in the area of the wart when you put weight on your foot. DIAGNOSIS  Diagnosis is made by physical examination. This means your caregiver discovers it while examining your foot.  TREATMENT  There are many ways to treat plantar warts. However, warts are very tough. Sometimes it is difficult to treat them so that they go away completely and do not grow back. Any treatment must be done regularly to work. If left untreated, most plantar warts will eventually disappear over a period of one to two  years. Treatments you can do at home include:  Putting duct tape over the top of the wart (occlusion) has been found to be effective over several months. The duct tape should be removed each night and reapplied until the wart has disappeared.  Placing over-the-counter medications on top of the wart to help kill the wart virus and remove the wart tissue (salicylic acid, cantharidin, and dichloroacetic acid) are useful. These are called keratolytic agents. These medications make the skin soft and gradually layers will shed away. These compounds are usually placed on the wart each night and then covered with a bandage. They are also available in premedicated bandage form. Avoid surrounding skin when applying these liquids as these medications can burn healthy skin. The treatment may take several months of nightly use to be effective.  Cryotherapy to freeze the wart has recently become available over-the-counter for children 4 years and older. This system makes use of a soft narrow applicator connected to a bottle of compressed cold liquid that is applied directly to the wart. This medication can burn healthy skin and should be used with caution.  As with all over-the-counter medications, read the directions carefully before use. Treatments generally done in your caregiver's office include:  Some aggressive treatments may cause discomfort, discoloration, and scarring of the surrounding skin. The risks and benefits of treatment should be discussed with your caregiver.  Freezing the wart with liquid nitrogen (cryotherapy, see above).  Burning the wart with use of very high heat (cautery).  Injecting medication into the wart.  Surgically removing or laser treatment of the wart.  Your caregiver may refer you to a dermatologist for difficult to treat large-sized warts or large numbers of warts. HOME CARE INSTRUCTIONS   Soak the affected area in warm water. Dry the area completely when you are done.  Remove the top layer of softened skin, then apply the chosen topical medication and reapply a bandage.  Remove the bandage daily and file excess wart tissue (pumice stone works well for this purpose). Repeat the entire process daily or every other day for weeks until the plantar wart disappears.  Several brands of salicylic acid pads are available as over-the-counter remedies.  Pain can be relieved by wearing a donut bandage. This is a bandage with a hole in it. The bandage is put on with the hole over the wart. This helps take the pressure off the wart and gives pain relief. To help prevent plantar warts:  Wear shoes and socks and change them daily.  Keep feet clean and dry.  Check your feet and your children's feet regularly.  Avoid direct contact with warts on other people.  Have growths or changes on your skin checked by your caregiver. Document Released: 10/25/2003 Document Revised: 12/19/2013 Document Reviewed: 04/04/2009 [facility] Patient Information 2015 [place], [place]. This information is not intended to replace advice given to you by your health care provider. Make sure you discuss any questions you have with your health care provider.

## 2015-04-19 NOTE — Progress Notes (Signed)
Pre visit review using our clinic review tool, if applicable. No additional management support is needed unless otherwise documented below in the visit note. 

## 2015-04-19 NOTE — Assessment & Plan Note (Signed)
Improved with addition of levothyroxine. Denies adverse side effects. Increase levothyroxine to 50 mcg. Follow up TSH in 6 weeks.

## 2015-04-19 NOTE — Assessment & Plan Note (Signed)
Symptoms and exam consistent with plantar warts. Treat conservatively and would potentially like to have it removed. Trail duct tape, if unsuccessful will treat with cryosurgery to remove.

## 2015-04-19 NOTE — Progress Notes (Signed)
   Subjective:    Patient ID: Gerald Watts, male    DOB: 1990-10-13, 24 y.o.   MRN: [id number]  Chief Complaint  Patient presents with  . Follow-up    has not tried the viagra, his insurance did not cover it    HPI:  Gerald Watts is a 24 y.o. male with a PMH of snoring, erectile dysfunction, and obesity who presents today for a follow up office visit.  1.) Warts on foot - Associated symptom of a wart located on his bilateral heel has been going for a couple of months. Has tried over the counter medication which has not helped very much.   2.) Hypothyroidism - Noted to have elevated TSH on previous blood work and notes some improvement with the levothyroxine. Takes the medication as prescribed and denies adverse side effects.  No Known Allergies   Current Outpatient Prescriptions on File Prior to Visit  Medication Sig Dispense Refill  . fenofibrate (TRICOR) 145 MG tablet Take 1 tablet (145 mg total) by mouth daily. 30 tablet 1   No current facility-administered medications on file prior to visit.    Review of Systems  Constitutional: Positive for fatigue. Negative for fever and chills.  Skin:       Positive for wart on foot.       Objective:    BP 134/88 mmHg  Pulse 73  Temp(Src) 97.4 F (36.3 C) (Oral)  Resp 18  Ht  (1.803 m)  Wt 213 lb (96.616 kg)  BMI 29.72 kg/m2  SpO2 98% Nursing note and vital signs reviewed.  Physical Exam  Constitutional: He is oriented to person, place, and time. He appears well-developed and well-nourished. No distress.  Cardiovascular: Normal rate, regular rhythm, normal heart sounds and intact distal pulses.   Pulmonary/Chest: Effort normal and breath sounds normal.  Neurological: He is alert and oriented to person, place, and time.  Skin: Skin is warm and dry.  2-3 mm annular wart noted on bilateral heels.  Psychiatric: He has a normal mood and affect. His behavior is normal. Judgment and thought content normal.         Assessment & Plan:   Problem List Items Addressed This Visit      Endocrine   Hypothyroidism    Improved with addition of levothyroxine. Denies adverse side effects. Increase levothyroxine to 50 mcg. Follow up TSH in 6 weeks.       Relevant Medications   levothyroxine (SYNTHROID, LEVOTHROID) 50 MCG tablet     Musculoskeletal and Integument   Plantar wart of both feet - Primary    Symptoms and exam consistent with plantar warts. Treat conservatively and would potentially like to have it removed. Trail duct tape, if unsuccessful will treat with cryosurgery to remove.

## 2015-05-09 ENCOUNTER — Encounter: Payer: Self-pay | Admitting: Family

## 2015-05-09 ENCOUNTER — Ambulatory Visit (HOSPITAL_BASED_OUTPATIENT_CLINIC_OR_DEPARTMENT_OTHER): Payer: BLUE CROSS/BLUE SHIELD | Attending: Family | Admitting: Radiology

## 2015-05-09 VITALS — Ht 71.0 in | Wt 205.0 lb

## 2015-05-09 DIAGNOSIS — R0683 Snoring: Secondary | ICD-10-CM | POA: Insufficient documentation

## 2015-05-09 DIAGNOSIS — G4700 Insomnia, unspecified: Secondary | ICD-10-CM | POA: Diagnosis not present

## 2015-05-09 DIAGNOSIS — R5383 Other fatigue: Secondary | ICD-10-CM | POA: Insufficient documentation

## 2015-05-13 DIAGNOSIS — R0683 Snoring: Secondary | ICD-10-CM | POA: Diagnosis not present

## 2015-05-13 NOTE — Progress Notes (Signed)
  Patient Name: Gerald Watts, Gerald Watts Date: 05/09/2015 Gender: Male D.O.B: 09-May-1991 Age (years): 24 Referring Camerin Jimenez: Veryl Speak Height (inches): 71 Interpreting Physician: Jetty Duhamel MD, ABSM Weight (lbs): 205 RPSGT: Shelah Lewandowsky BMI: 29 MRN: [id number] Neck Size: 16.00 CLINICAL INFORMATION Sleep Study Type: NPSG  Indication for sleep study: Daytime Fatigue, Fatigue, Insomnia, Snoring, Witnesses Apnea / Gasping During Sleep  Epworth Sleepiness Score: 5  SLEEP STUDY TECHNIQUE As per the AASM Manual for the Scoring of Sleep and Associated Events v2.3 (April 2016) with a hypopnea requiring 4% desaturations.  The channels recorded and monitored were frontal, central and occipital EEG, electrooculogram (EOG), submentalis EMG (chin), nasal and oral airflow, thoracic and abdominal wall motion, anterior tibialis EMG, snore microphone, electrocardiogram, and pulse oximetry.  MEDICATIONS Patient's medications include: charted for review Medications self-administered by patient during sleep study : No sleep medicine administered.  SLEEP ARCHITECTURE The study was initiated at 10:52:45 PM and ended at 4:57:24 AM.  Sleep onset time was 30.3 minutes and the sleep efficiency was 85.3%. The total sleep time was 311.0 minutes.  Stage REM latency was 110.0 minutes.  The patient spent 17.36% of the night in stage N1 sleep, 72.03% in stage N2 sleep, 0.80% in stage N3 and 9.81% in REM.  Alpha intrusion was absent.  Supine sleep was 55.47%.  Wake after sleep onset 23.3 minutes  RESPIRATORY PARAMETERS The overall apnea/hypopnea index (AHI) was 1.5 per hour. There were 0 total apneas, including 0 obstructive, 0 central and 0 mixed apneas. There were 8 hypopneas and 84 RERAs.  The AHI during Stage REM sleep was 3.9 per hour.  AHI while supine was 2.4 per hour.  The mean oxygen saturation was 95.67%. The minimum SpO2 during sleep was 91.00%.  Loud snoring was noted  during this study.  CARDIAC DATA The 2 lead EKG demonstrated sinus rhythm. The mean heart rate was 67.98 beats per minute. Other EKG findings include: None.  LEG MOVEMENT DATA The total PLMS were 1 with a resulting PLMS index of 0.19. Associated arousal with leg movement index was 0.0 .  IMPRESSIONS No significant obstructive sleep apnea occurred during this study (AHI = 1.5/h). No significant central sleep apnea occurred during this study (CAI = 0.0/h). Mild oxygen desaturation was noted during this study (Min O2 = 91.00%). The patient snored with Loud snoring volume. No cardiac abnormalities were noted during this study. Clinically significant periodic limb movements did not occur during sleep. No significant associated arousals.   DIAGNOSIS Primary Snoring (786.09 [R06.83 ICD-10]) Normal study  RECOMMENDATIONS Avoid alcohol, sedatives and other CNS depressants that may worsen sleep apnea and disrupt normal sleep architecture. Sleep hygiene should be reviewed to assess factors that may improve sleep quality. Weight management and regular exercise should be initiated or continued if appropriate.  Waymon Budge Diplomate, American Board of Sleep Medicine  ELECTRONICALLY SIGNED ON:  05/13/2015, 12:16 PM [facility] PH: [phone]   FX: [id number] ACCREDITED BY THE AMERICAN ACADEMY OF SLEEP MEDICINE

## 2015-05-21 ENCOUNTER — Ambulatory Visit: Payer: Self-pay | Admitting: Family

## 2015-06-04 ENCOUNTER — Other Ambulatory Visit: Payer: Self-pay | Admitting: Family

## 2015-06-19 ENCOUNTER — Encounter: Payer: Self-pay | Admitting: Family

## 2015-10-15 ENCOUNTER — Other Ambulatory Visit (INDEPENDENT_AMBULATORY_CARE_PROVIDER_SITE_OTHER): Payer: BLUE CROSS/BLUE SHIELD

## 2015-10-15 ENCOUNTER — Encounter: Payer: Self-pay | Admitting: Family

## 2015-10-15 ENCOUNTER — Ambulatory Visit (INDEPENDENT_AMBULATORY_CARE_PROVIDER_SITE_OTHER): Payer: BLUE CROSS/BLUE SHIELD | Admitting: Family

## 2015-10-15 VITALS — BP 122/82 | HR 103 | Temp 98.3°F | Resp 16 | Ht 71.0 in | Wt 226.8 lb

## 2015-10-15 DIAGNOSIS — E781 Pure hyperglyceridemia: Secondary | ICD-10-CM

## 2015-10-15 DIAGNOSIS — R5383 Other fatigue: Secondary | ICD-10-CM

## 2015-10-15 DIAGNOSIS — S39012A Strain of muscle, fascia and tendon of lower back, initial encounter: Secondary | ICD-10-CM

## 2015-10-15 DIAGNOSIS — E039 Hypothyroidism, unspecified: Secondary | ICD-10-CM

## 2015-10-15 LAB — LIPID PANEL
CHOL/HDL RATIO: 8
CHOLESTEROL: 173 mg/dL (ref 0–200)
HDL: 22.6 mg/dL — ABNORMAL LOW (ref >39.00)

## 2015-10-15 LAB — TESTOSTERONE: Testosterone: 252.84 ng/dL — ABNORMAL LOW (ref 300.00–890.00)

## 2015-10-15 LAB — LDL CHOLESTEROL, DIRECT: LDL DIRECT: 58 mg/dL

## 2015-10-15 LAB — TSH: TSH: 3.18 u[IU]/mL (ref 0.35–4.50)

## 2015-10-15 MED ORDER — TIZANIDINE HCL 4 MG PO TABS: 4.0000 mg | ORAL_TABLET | Freq: Four times a day (QID) | ORAL | Status: DC | PRN

## 2015-10-15 MED ORDER — DICLOFENAC SODIUM 2 % TD SOLN: 1.0000 "application " | Freq: Two times a day (BID) | TRANSDERMAL | Status: DC | PRN

## 2015-10-15 MED ORDER — FENOFIBRATE 145 MG PO TABS: 145.0000 mg | ORAL_TABLET | Freq: Every day | ORAL | Status: DC

## 2015-10-15 MED ORDER — LEVOTHYROXINE SODIUM 50 MCG PO TABS: 25.0000 ug | ORAL_TABLET | Freq: Every day | ORAL | Status: DC

## 2015-10-15 MED ORDER — NAPROXEN-ESOMEPRAZOLE 500-20 MG PO TBEC: 1.0000 | DELAYED_RELEASE_TABLET | Freq: Two times a day (BID) | ORAL | Status: DC | PRN

## 2015-10-15 NOTE — Assessment & Plan Note (Signed)
Symptoms and exam consistent with low back strain. Start Pennsaid, Vimovo, and Zanaflex. Ice multiple times throughout the day and home exercise therapy initiated. Follow-up in 3 weeks if symptoms worsen or fail to improve.

## 2015-10-15 NOTE — Progress Notes (Signed)
Subjective:    Patient ID: Gerald Watts, male    DOB: [birth year], 25 y.o.   MRN: [id number]  Chief Complaint  Patient presents with  . Back Pain    has had back pain for a couple of weeks, has tried ice, heat, and anti inflammatories and nothing has helped, was moving a couple of weeks ago and picked up a book shelf and has had back pain since, lower back    HPI:  Gerald Watts is a 25 y.o. male who  has a past medical history of Erectile dysfunction (03/19/2015); Hypertriglyceridemia (08/07/2014); and Hypothyroidism (04/19/2015). and presents todayFor an acute office visit.  1.) Low back - This is a new problem. Associated symptom of pain located in his lumbar paraspinal musculature radiating to both sides has been going on for approximately a couple of weeks. This started when he picked up a book shelf with the buttocks remaining in the shelf. Pain is described as sharp and mostly constant. Modifying factors include ice, heat, and anti-inflammatories which have helped dull the pain. His also attempted stretching which helps to relieve the pain on occasion. Aggravating factors include sitting and standing for extended periods of time. Severity of symptoms is enough to disturb his sleep secondary to muscle spasm. No radiculopathy, numbness, tingling, or changes to urine/bladder habits or saddle anesthesia.  2.) Medication refills.- Indicates that he has been out of medications for his hypertriglyceridemia and hypothyroidism for the past months.   No Known Allergies   Current Outpatient Prescriptions on File Prior to Visit  Medication Sig Dispense Refill  . levothyroxine (SYNTHROID, LEVOTHROID) 50 MCG tablet Take 0.5 tablets (25 mcg total) by mouth daily before breakfast. (Patient not taking: Reported on 10/15/2015) 30 tablet 1   No current facility-administered medications on file prior to visit.    Past Medical History  Diagnosis Date  . Erectile dysfunction 03/19/2015  .  Hypertriglyceridemia 08/07/2014  . Hypothyroidism 04/19/2015     Review of Systems  Constitutional: Negative for fever and chills.  Musculoskeletal: Positive for back pain.  Neurological: Negative for weakness and numbness.      Objective:    BP 122/82 mmHg  Pulse 103  Temp(Src) 98.3 F (36.8 C) (Oral)  Resp 16  Ht  (1.803 m)  Wt 226 lb 12.8 oz (102.876 kg)  BMI 31.65 kg/m2  SpO2 97% Nursing note and vital signs reviewed.  Physical Exam  Constitutional: He is oriented to person, place, and time. He appears well-developed and well-nourished. No distress.  Cardiovascular: Normal rate, regular rhythm, normal heart sounds and intact distal pulses.   Pulmonary/Chest: Effort normal and breath sounds normal.  Musculoskeletal:  Low back  - No obvious deformity, discoloration, or edema noted. Range of motion is within normal limits with discomfort noted around extension and terminal extension. All other motions are without pain. Straight leg raise is negative. Faber's test is negative. Distal pulses and sensation are intact and appropriate.  Neurological: He is alert and oriented to person, place, and time.  Skin: Skin is warm and dry.  Psychiatric: He has a normal mood and affect. His behavior is normal. Judgment and thought content normal.       Assessment & Plan:   Problem List Items Addressed This Visit      Endocrine   Hypothyroidism - Primary   Relevant Orders   TSH (Completed)     Musculoskeletal and Integument   Low back strain    Symptoms and exam consistent  with low back strain. Start Pennsaid, Vimovo, and Zanaflex. Ice multiple times throughout the day and home exercise therapy initiated. Follow-up in 3 weeks if symptoms worsen or fail to improve.      Relevant Medications   Naproxen-Esomeprazole 500-20 MG TBEC   Diclofenac Sodium (PENNSAID) 2 % SOLN   tiZANidine (ZANAFLEX) 4 MG tablet     Other   Hypertriglyceridemia   Relevant Medications    fenofibrate (TRICOR) 145 MG tablet

## 2015-10-15 NOTE — Patient Instructions (Addendum)
Thank you for choosing [facility].  Summary/Instructions:  Ice 2-3 times per day and at night as needed. Stretching and exercises daily. Pennsaid - 2 pinkie sized dose twice daily Vimovo - 2 times daily.  Zanaflex - as needed for muslce spasm.   Your prescription(s) have been submitted to your pharmacy or been printed and provided for you. Please take as directed and contact our office if you believe you are having problem(s) with the medication(s) or have any questions.  Please stop by the lab on the basement level of the building for your blood work. Your results will be released to MyChart (or called to you) after review, usually within 72 hours after test completion. If any changes need to be made, you will be notified at that same time.  If your symptoms worsen or fail to improve, please contact our office for further instruction, or in case of emergency go directly to the emergency room at the closest medical facility.    Low Back Strain With Rehab A strain is an injury in which a tendon or muscle is torn. The muscles and tendons of the lower back are vulnerable to strains. However, these muscles and tendons are very strong and require a great force to be injured. Strains are classified into three categories. Grade 1 strains cause pain, but the tendon is not lengthened. Grade 2 strains include a lengthened ligament, due to the ligament being stretched or partially ruptured. With grade 2 strains there is still function, although the function may be decreased. Grade 3 strains involve a complete tear of the tendon or muscle, and function is usually impaired. SYMPTOMS   Pain in the lower back.  Pain that affects one side more than the other.  Pain that gets worse with movement and may be felt in the hip, buttocks, or back of the thigh.  Muscle spasms of the muscles in the back.  Swelling along the muscles of the back.  Loss of strength of the back muscles.  Crackling sound  (crepitation) when the muscles are touched. CAUSES  Lower back strains occur when a force is placed on the muscles or tendons that is greater than they can handle. Common causes of injury include:  Prolonged overuse of the muscle-tendon units in the lower back, usually from incorrect posture.  A single violent injury or force applied to the back. RISK INCREASES WITH:  Sports that involve twisting forces on the spine or a lot of bending at the waist (football, rugby, weightlifting, bowling, golf, tennis, speed skating, racquetball, swimming, running, gymnastics, diving).  Poor strength and flexibility.  Failure to warm up properly before activity.  Family history of lower back pain or disk disorders.  Previous back injury or surgery (especially fusion).  Poor posture with lifting, especially heavy objects.  Prolonged sitting, especially with poor posture. PREVENTION   Learn and use proper posture when sitting or lifting (maintain proper posture when sitting, lift using the knees and legs, not at the waist).  Warm up and stretch properly before activity.  Allow for adequate recovery between workouts.  Maintain physical fitness:  Strength, flexibility, and endurance.  Cardiovascular fitness. PROGNOSIS  If treated properly, lower back strains usually heal within 6 weeks. RELATED COMPLICATIONS   Recurring symptoms, resulting in a chronic problem.  Chronic inflammation, scarring, and partial muscle-tendon tear.  Delayed healing or resolution of symptoms.  Prolonged disability. TREATMENT  Treatment first involves the use of ice and medicine, to reduce pain and inflammation. The use  of strengthening and stretching exercises may help reduce pain with activity. These exercises may be performed at home or with a therapist. Severe injuries may require referral to a therapist for further evaluation and treatment, such as ultrasound. Your caregiver may advise that you wear a back  brace or corset, to help reduce pain and discomfort. Often, prolonged bed rest results in greater harm then benefit. Corticosteroid injections may be recommended. However, these should be reserved for the most serious cases. It is important to avoid using your back when lifting objects. At night, sleep on your back on a firm mattress with a pillow placed under your knees. If non-surgical treatment is unsuccessful, surgery may be needed.  MEDICATION   If pain medicine is needed, nonsteroidal anti-inflammatory medicines (aspirin and ibuprofen), or other minor pain relievers (acetaminophen), are often advised.  Do not take pain medicine for 7 days before surgery.  Prescription pain relievers may be given, if your caregiver thinks they are needed. Use only as directed and only as much as you need.  Ointments applied to the skin may be helpful.  Corticosteroid injections may be given by your caregiver. These injections should be reserved for the most serious cases, because they may only be given a certain number of times. HEAT AND COLD  Cold treatment (icing) should be applied for 10 to 15 minutes every 2 to 3 hours for inflammation and pain, and immediately after activity that aggravates your symptoms. Use ice packs or an ice massage.  Heat treatment may be used before performing stretching and strengthening activities prescribed by your caregiver, physical therapist, or athletic trainer. Use a heat pack or a warm water soak. SEEK MEDICAL CARE IF:   Symptoms get worse or do not improve in 2 to 4 weeks, despite treatment.  You develop numbness, weakness, or loss of bowel or bladder function.  New, unexplained symptoms develop. (Drugs used in treatment may produce side effects.) EXERCISES  RANGE OF MOTION (ROM) AND STRETCHING EXERCISES - Low Back Strain Most people with lower back pain will find that their symptoms get worse with excessive bending forward (flexion) or arching at the lower back  (extension). The exercises which will help resolve your symptoms will focus on the opposite motion.  Your physician, physical therapist or athletic trainer will help you determine which exercises will be most helpful to resolve your lower back pain. Do not complete any exercises without first consulting with your caregiver. Discontinue any exercises which make your symptoms worse until you speak to your caregiver.  If you have pain, numbness or tingling which travels down into your buttocks, leg or foot, the goal of the therapy is for these symptoms to move closer to your back and eventually resolve. Sometimes, these leg symptoms will get better, but your lower back pain may worsen. This is typically an indication of progress in your rehabilitation. Be very alert to any changes in your symptoms and the activities in which you participated in the 24 hours prior to the change. Sharing this information with your caregiver will allow him/her to most efficiently treat your condition.  These exercises may help you when beginning to rehabilitate your injury. Your symptoms may resolve with or without further involvement from your physician, physical therapist or athletic trainer. While completing these exercises, remember:  Restoring tissue flexibility helps normal motion to return to the joints. This allows healthier, less painful movement and activity.  An effective stretch should be held for at least 30 seconds.  A  stretch should never be painful. You should only feel a gentle lengthening or release in the stretched tissue. FLEXION RANGE OF MOTION AND STRETCHING EXERCISES: STRETCH - Flexion, Single Knee to Chest   Lie on a firm bed or floor with both legs extended in front of you.  Keeping one leg in contact with the floor, bring your opposite knee to your chest. Hold your leg in place by either grabbing behind your thigh or at your knee.  Pull until you feel a gentle stretch in your lower back. Hold  __________ seconds.  Slowly release your grasp and repeat the exercise with the opposite side. Repeat __________ times. Complete this exercise __________ times per day.  STRETCH - Flexion, Double Knee to Chest   Lie on a firm bed or floor with both legs extended in front of you.  Keeping one leg in contact with the floor, bring your opposite knee to your chest.  Tense your stomach muscles to support your back and then lift your other knee to your chest. Hold your legs in place by either grabbing behind your thighs or at your knees.  Pull both knees toward your chest until you feel a gentle stretch in your lower back. Hold __________ seconds.  Tense your stomach muscles and slowly return one leg at a time to the floor. Repeat __________ times. Complete this exercise __________ times per day.  STRETCH - Low Trunk Rotation  Lie on a firm bed or floor. Keeping your legs in front of you, bend your knees so they are both pointed toward the ceiling and your feet are flat on the floor.  Extend your arms out to the side. This will stabilize your upper body by keeping your shoulders in contact with the floor.  Gently and slowly drop both knees together to one side until you feel a gentle stretch in your lower back. Hold for __________ seconds.  Tense your stomach muscles to support your lower back as you bring your knees back to the starting position. Repeat the exercise to the other side. Repeat __________ times. Complete this exercise __________ times per day  EXTENSION RANGE OF MOTION AND FLEXIBILITY EXERCISES: STRETCH - Extension, Prone on Elbows   Lie on your stomach on the floor, a bed will be too soft. Place your palms about shoulder width apart and at the height of your head.  Place your elbows under your shoulders. If this is too painful, stack pillows under your chest.  Allow your body to relax so that your hips drop lower and make contact more completely with the floor.  Hold this  position for __________ seconds.  Slowly return to lying flat on the floor. Repeat __________ times. Complete this exercise __________ times per day.  RANGE OF MOTION - Extension, Prone Press Ups  Lie on your stomach on the floor, a bed will be too soft. Place your palms about shoulder width apart and at the height of your head.  Keeping your back as relaxed as possible, slowly straighten your elbows while keeping your hips on the floor. You may adjust the placement of your hands to maximize your comfort. As you gain motion, your hands will come more underneath your shoulders.  Hold this position __________ seconds.  Slowly return to lying flat on the floor. Repeat __________ times. Complete this exercise __________ times per day.  RANGE OF MOTION- Quadruped, Neutral Spine   Assume a hands and knees position on a firm surface. Keep your hands under your  shoulders and your knees under your hips. You may place padding under your knees for comfort.  Drop your head and point your tail bone toward the ground below you. This will round out your lower back like an angry cat. Hold this position for __________ seconds.  Slowly lift your head and release your tail bone so that your back sags into a large arch, like an old horse.  Hold this position for __________ seconds.  Repeat this until you feel limber in your lower back.  Now, find your "sweet spot." This will be the most comfortable position somewhere between the two previous positions. This is your neutral spine. Once you have found this position, tense your stomach muscles to support your lower back.  Hold this position for __________ seconds. Repeat __________ times. Complete this exercise __________ times per day.  STRENGTHENING EXERCISES - Low Back Strain These exercises may help you when beginning to rehabilitate your injury. These exercises should be done near your "sweet spot." This is the neutral, low-back arch, somewhere between  fully rounded and fully arched, that is your least painful position. When performed in this safe range of motion, these exercises can be used for people who have either a flexion or extension based injury. These exercises may resolve your symptoms with or without further involvement from your physician, physical therapist or athletic trainer. While completing these exercises, remember:   Muscles can gain both the endurance and the strength needed for everyday activities through controlled exercises.  Complete these exercises as instructed by your physician, physical therapist or athletic trainer. Increase the resistance and repetitions only as guided.  You may experience muscle soreness or fatigue, but the pain or discomfort you are trying to eliminate should never worsen during these exercises. If this pain does worsen, stop and make certain you are following the directions exactly. If the pain is still present after adjustments, discontinue the exercise until you can discuss the trouble with your caregiver. STRENGTHENING - Deep Abdominals, Pelvic Tilt  Lie on a firm bed or floor. Keeping your legs in front of you, bend your knees so they are both pointed toward the ceiling and your feet are flat on the floor.  Tense your lower abdominal muscles to press your lower back into the floor. This motion will rotate your pelvis so that your tail bone is scooping upwards rather than pointing at your feet or into the floor.  With a gentle tension and even breathing, hold this position for __________ seconds. Repeat __________ times. Complete this exercise __________ times per day.  STRENGTHENING - Abdominals, Crunches   Lie on a firm bed or floor. Keeping your legs in front of you, bend your knees so they are both pointed toward the ceiling and your feet are flat on the floor. Cross your arms over your chest.  Slightly tip your chin down without bending your neck.  Tense your abdominals and slowly lift  your trunk high enough to just clear your shoulder blades. Lifting higher can put excessive stress on the lower back and does not further strengthen your abdominal muscles.  Control your return to the starting position. Repeat __________ times. Complete this exercise __________ times per day.  STRENGTHENING - Quadruped, Opposite UE/LE Lift   Assume a hands and knees position on a firm surface. Keep your hands under your shoulders and your knees under your hips. You may place padding under your knees for comfort.  Find your neutral spine and gently tense your abdominal  muscles so that you can maintain this position. Your shoulders and hips should form a rectangle that is parallel with the floor and is not twisted.  Keeping your trunk steady, lift your right hand no higher than your shoulder and then your left leg no higher than your hip. Make sure you are not holding your breath. Hold this position __________ seconds.  Continuing to keep your abdominal muscles tense and your back steady, slowly return to your starting position. Repeat with the opposite arm and leg. Repeat __________ times. Complete this exercise __________ times per day.  STRENGTHENING - Lower Abdominals, Double Knee Lift  Lie on a firm bed or floor. Keeping your legs in front of you, bend your knees so they are both pointed toward the ceiling and your feet are flat on the floor.  Tense your abdominal muscles to brace your lower back and slowly lift both of your knees until they come over your hips. Be certain not to hold your breath.  Hold __________ seconds. Using your abdominal muscles, return to the starting position in a slow and controlled manner. Repeat __________ times. Complete this exercise __________ times per day.  POSTURE AND BODY MECHANICS CONSIDERATIONS - Low Back Strain Keeping correct posture when sitting, standing or completing your activities will reduce the stress put on different body tissues, allowing  injured tissues a chance to heal and limiting painful experiences. The following are general guidelines for improved posture. Your physician or physical therapist will provide you with any instructions specific to your needs. While reading these guidelines, remember:  The exercises prescribed by your provider will help you have the flexibility and strength to maintain correct postures.  The correct posture provides the best environment for your joints to work. All of your joints have less wear and tear when properly supported by a spine with good posture. This means you will experience a healthier, less painful body.  Correct posture must be practiced with all of your activities, especially prolonged sitting and standing. Correct posture is as important when doing repetitive low-stress activities (typing) as it is when doing a single heavy-load activity (lifting). RESTING POSITIONS Consider which positions are most painful for you when choosing a resting position. If you have pain with flexion-based activities (sitting, bending, stooping, squatting), choose a position that allows you to rest in a less flexed posture. You would want to avoid curling into a fetal position on your side. If your pain worsens with extension-based activities (prolonged standing, working overhead), avoid resting in an extended position such as sleeping on your stomach. Most people will find more comfort when they rest with their spine in a more neutral position, neither too rounded nor too arched. Lying on a non-sagging bed on your side with a pillow between your knees, or on your back with a pillow under your knees will often provide some relief. Keep in mind, being in any one position for a prolonged period of time, no matter how correct your posture, can still lead to stiffness. PROPER SITTING POSTURE In order to minimize stress and discomfort on your spine, you must sit with correct posture. Sitting with good posture should  be effortless for a healthy body. Returning to good posture is a gradual process. Many people can work toward this most comfortably by using various supports until they have the flexibility and strength to maintain this posture on their own. When sitting with proper posture, your ears will fall over your shoulders and your shoulders will fall over  your hips. You should use the back of the chair to support your upper back. Your lower back will be in a neutral position, just slightly arched. You may place a small pillow or folded towel at the base of your lower back for support.  When working at a desk, create an environment that supports good, upright posture. Without extra support, muscles tire, which leads to excessive strain on joints and other tissues. Keep these recommendations in mind: CHAIR:  A chair should be able to slide under your desk when your back makes contact with the back of the chair. This allows you to work closely.  The chair's height should allow your eyes to be level with the upper part of your monitor and your hands to be slightly lower than your elbows. BODY POSITION  Your feet should make contact with the floor. If this is not possible, use a foot rest.  Keep your ears over your shoulders. This will reduce stress on your neck and lower back. INCORRECT SITTING POSTURES  If you are feeling tired and unable to assume a healthy sitting posture, do not slouch or slump. This puts excessive strain on your back tissues, causing more damage and pain. Healthier options include:  Using more support, like a lumbar pillow.  Switching tasks to something that requires you to be upright or walking.  Talking a brief walk.  Lying down to rest in a neutral-spine position. PROLONGED STANDING WHILE SLIGHTLY LEANING FORWARD  When completing a task that requires you to lean forward while standing in one place for a long time, place either foot up on a stationary 2-4 inch high object to help  maintain the best posture. When both feet are on the ground, the lower back tends to lose its slight inward curve. If this curve flattens (or becomes too large), then the back and your other joints will experience too much stress, tire more quickly, and can cause pain. CORRECT STANDING POSTURES Proper standing posture should be assumed with all daily activities, even if they only take a few moments, like when brushing your teeth. As in sitting, your ears should fall over your shoulders and your shoulders should fall over your hips. You should keep a slight tension in your abdominal muscles to brace your spine. Your tailbone should point down to the ground, not behind your body, resulting in an over-extended swayback posture.  INCORRECT STANDING POSTURES  Common incorrect standing postures include a forward head, locked knees and/or an excessive swayback. WALKING Walk with an upright posture. Your ears, shoulders and hips should all line-up. PROLONGED ACTIVITY IN A FLEXED POSITION When completing a task that requires you to bend forward at your waist or lean over a low surface, try to find a way to stabilize 3 out of 4 of your limbs. You can place a hand or elbow on your thigh or rest a knee on the surface you are reaching across. This will provide you more stability so that your muscles do not fatigue as quickly. By keeping your knees relaxed, or slightly bent, you will also reduce stress across your lower back. CORRECT LIFTING TECHNIQUES DO :   Assume a wide stance. This will provide you more stability and the opportunity to get as close as possible to the object which you are lifting.  Tense your abdominals to brace your spine. Bend at the knees and hips. Keeping your back locked in a neutral-spine position, lift using your leg muscles. Lift with your legs, keeping  your back straight.  Test the weight of unknown objects before attempting to lift them.  Try to keep your elbows locked down at your  sides in order get the best strength from your shoulders when carrying an object.  Always ask for help when lifting heavy or awkward objects. INCORRECT LIFTING TECHNIQUES DO NOT:   Lock your knees when lifting, even if it is a small object.  Bend and twist. Pivot at your feet or move your feet when needing to change directions.  Assume that you can safely pick up even a paper clip without proper posture.   This information is not intended to replace advice given to you by your health care provider. Make sure you discuss any questions you have with your health care provider.   Document Released: 08/04/2005 Document Revised: 08/25/2014 Document Reviewed: 11/16/2008 [employer] Interactive Patient Education [employer]

## 2015-10-15 NOTE — Progress Notes (Signed)
Pre visit review using our clinic review tool, if applicable. No additional management support is needed unless otherwise documented below in the visit note. 

## 2015-11-11 ENCOUNTER — Other Ambulatory Visit: Payer: Self-pay | Admitting: Family

## 2016-01-15 ENCOUNTER — Encounter: Payer: Self-pay | Admitting: Family

## 2016-01-15 ENCOUNTER — Other Ambulatory Visit: Payer: Self-pay | Admitting: Family

## 2016-01-15 ENCOUNTER — Ambulatory Visit: Payer: BLUE CROSS/BLUE SHIELD | Admitting: Family

## 2016-01-15 ENCOUNTER — Ambulatory Visit (INDEPENDENT_AMBULATORY_CARE_PROVIDER_SITE_OTHER): Payer: BLUE CROSS/BLUE SHIELD | Admitting: Family

## 2016-01-15 ENCOUNTER — Other Ambulatory Visit (INDEPENDENT_AMBULATORY_CARE_PROVIDER_SITE_OTHER): Payer: BLUE CROSS/BLUE SHIELD

## 2016-01-15 VITALS — BP 114/78 | HR 105 | Temp 98.1°F | Resp 14 | Ht 71.0 in | Wt 228.0 lb

## 2016-01-15 DIAGNOSIS — R7989 Other specified abnormal findings of blood chemistry: Secondary | ICD-10-CM

## 2016-01-15 DIAGNOSIS — E291 Testicular hypofunction: Secondary | ICD-10-CM

## 2016-01-15 DIAGNOSIS — E039 Hypothyroidism, unspecified: Secondary | ICD-10-CM | POA: Diagnosis not present

## 2016-01-15 DIAGNOSIS — E781 Pure hyperglyceridemia: Secondary | ICD-10-CM | POA: Diagnosis not present

## 2016-01-15 LAB — LIPID PANEL
CHOLESTEROL: 180 mg/dL (ref 0–200)
HDL: 26 mg/dL — AB (ref >39.00)
LDL Cholesterol: 124 mg/dL — ABNORMAL HIGH (ref 0–99)
NonHDL: 154.4
Total CHOL/HDL Ratio: 7
Triglycerides: 153 mg/dL — ABNORMAL HIGH (ref 0.0–149.0)
VLDL: 30.6 mg/dL (ref 0.0–40.0)

## 2016-01-15 LAB — TESTOSTERONE: Testosterone: 329.63 ng/dL (ref 300.00–890.00)

## 2016-01-15 LAB — FOLLICLE STIMULATING HORMONE: FSH: 5.4 m[IU]/mL (ref 1.4–18.1)

## 2016-01-15 NOTE — Patient Instructions (Addendum)
Thank you for choosing [facility].  Summary/Instructions:  Please continue to take your medications as prescribed.   Pending your thyroid levels, if appropriate we will increase your levothyroxine to 75 g.  If your triglycerides remain elevated, we will consider addition of cholesterol medication.  If your symptoms worsen or fail to improve, please contact our office for further instruction, or in case of emergency go directly to the emergency room at the closest medical facility.   Fat and Cholesterol Restricted Diet High levels of fat and cholesterol in your blood may lead to various health problems, such as diseases of the heart, blood vessels, gallbladder, liver, and pancreas. Fats are concentrated sources of energy that come in various forms. Certain types of fat, including saturated fat, may be harmful in excess. Cholesterol is a substance needed by your body in small amounts. Your body makes all the cholesterol it needs. Excess cholesterol comes from the food you eat. When you have high levels of cholesterol and saturated fat in your blood, health problems can develop because the excess fat and cholesterol will gather along the walls of your blood vessels, causing them to narrow. Choosing the right foods will help you control your intake of fat and cholesterol. This will help keep the levels of these substances in your blood within normal limits and reduce your risk of disease. WHAT IS MY PLAN? Your health care provider recommends that you:  Get no more than __________ % of the total calories in your daily diet from fat.  Limit your intake of saturated fat to less than ______% of your total calories each day.  Limit the amount of cholesterol in your diet to less than _________mg per day. WHAT TYPES OF FAT SHOULD I CHOOSE?  Choose healthy fats more often. Choose monounsaturated and polyunsaturated fats, such as olive and canola oil, flaxseeds, walnuts, almonds, and  seeds.  Eat more omega-3 fats. Good choices include salmon, mackerel, sardines, tuna, flaxseed oil, and ground flaxseeds. Aim to eat fish at least two times a week.  Limit saturated fats. Saturated fats are primarily found in animal products, such as meats, butter, and cream. Plant sources of saturated fats include palm oil, palm kernel oil, and coconut oil.  Avoid foods with partially hydrogenated oils in them. These contain trans fats. Examples of foods that contain trans fats are stick margarine, some tub margarines, cookies, crackers, and other baked goods. WHAT GENERAL GUIDELINES DO I NEED TO FOLLOW? These guidelines for healthy eating will help you control your intake of fat and cholesterol:  Check food labels carefully to identify foods with trans fats or high amounts of saturated fat.  Fill one half of your plate with vegetables and green salads.  Fill one fourth of your plate with whole grains. Look for the word "whole" as the first word in the ingredient list.  Fill one fourth of your plate with lean protein foods.  Limit fruit to two servings a day. Choose fruit instead of juice.  Eat more foods that contain soluble fiber. Examples of foods that contain this type of fiber are apples, broccoli, carrots, beans, peas, and barley. Aim to get 20-30 g of fiber per day.  Eat more home-cooked food and less restaurant, buffet, and fast food.  Limit or avoid alcohol.  Limit foods high in starch and sugar.  Limit fried foods.  Cook foods using methods other than frying. Baking, boiling, grilling, and broiling are all great options.  Lose weight if you are overweight.  Losing just 5-10% of your initial body weight can help your overall health and prevent diseases such as diabetes and heart disease. WHAT FOODS CAN I EAT? Grains Whole grains, such as whole wheat or whole grain breads, crackers, cereals, and pasta. Unsweetened oatmeal, bulgur, barley, quinoa, or brown rice. Corn or  whole wheat flour tortillas. Vegetables Fresh or frozen vegetables (raw, steamed, roasted, or grilled). Green salads. Fruits All fresh, canned (in natural juice), or frozen fruits. Meat and Other Protein Products Ground beef (85% or leaner), grass-fed beef, or beef trimmed of fat. Skinless chicken or [place]. Ground chicken or [place]. Pork trimmed of fat. All fish and seafood. Eggs. Dried beans, peas, or lentils. Unsalted nuts or seeds. Unsalted canned or dry beans. Dairy Low-fat dairy products, such as skim or 1% milk, 2% or reduced-fat cheeses, low-fat ricotta or cottage cheese, or plain low-fat yogurt. Fats and Oils Tub margarines without trans fats. Light or reduced-fat mayonnaise and salad dressings. Avocado. Olive, canola, sesame, or safflower oils. Natural peanut or almond butter (choose ones without added sugar and oil). The items listed above may not be a complete list of recommended foods or beverages. Contact your dietitian for more options. WHAT FOODS ARE NOT RECOMMENDED? Grains White bread. White pasta. White rice. Cornbread. Bagels, pastries, and croissants. Crackers that contain trans fat. Vegetables White potatoes. Corn. Creamed or fried vegetables. Vegetables in a cheese sauce. Fruits Dried fruits. Canned fruit in light or heavy syrup. Fruit juice. Meat and Other Protein Products Fatty cuts of meat. Ribs, chicken wings, bacon, sausage, bologna, salami, chitterlings, fatback, hot dogs, bratwurst, and packaged luncheon meats. Liver and organ meats. Dairy Whole or 2% milk, cream, half-and-half, and cream cheese. Whole milk cheeses. Whole-fat or sweetened yogurt. Full-fat cheeses. Nondairy creamers and whipped toppings. Processed cheese, cheese spreads, or cheese curds. Sweets and Desserts Corn syrup, sugars, honey, and molasses. Candy. Jam and jelly. Syrup. Sweetened cereals. Cookies, pies, cakes, donuts, muffins, and ice cream. Fats and Oils Butter, stick margarine, lard,  shortening, ghee, or bacon fat. Coconut, palm kernel, or palm oils. Beverages Alcohol. Sweetened drinks (such as sodas, lemonade, and fruit drinks or punches). The items listed above may not be a complete list of foods and beverages to avoid. Contact your dietitian for more information.   This information is not intended to replace advice given to you by your health care provider. Make sure you discuss any questions you have with your health care provider.   Document Released: 08/04/2005 Document Revised: 08/25/2014 Document Reviewed: 11/02/2013 Elsevier Interactive Patient Education [employer]2016 [employer] Inc.

## 2016-01-15 NOTE — Assessment & Plan Note (Signed)
Obtain lipid profile to check current status. Stable with fenofibrate. Continues to work on nutrition and physical activity. Continue current dosage of fenofibrate pending lipid profile results. Consider addition of statin if triglycerides remain elevated and HDL remains low.

## 2016-01-15 NOTE — Assessment & Plan Note (Signed)
Hypothyroidism appears stable with current dosage of levothyroxine although some fatigue continues to exist. Patient would like to increase medication dosage pending TSH results. Continue current dosage of levothyroxine pending results.

## 2016-01-15 NOTE — Progress Notes (Signed)
Pre visit review using our clinic review tool, if applicable. No additional management support is needed unless otherwise documented below in the visit note. 

## 2016-01-15 NOTE — Progress Notes (Signed)
Subjective:    Patient ID: Gerald Rowanimothy W Brickner, male    DOB: 11/02/1990, 25 y.o.   MRN: [id number]  Chief Complaint  Patient presents with  . Follow-up    back pain is better and did thyroid blood test this morning    HPI:  Gerald Watts is a 25 y.o. male who  has a past medical history of Erectile dysfunction (03/19/2015); Hypertriglyceridemia (08/07/2014); and Hypothyroidism (04/19/2015). and presents today for a follow up office visit.  1.) Hypothyroidism - Currently maintained on levothyroxine. Reports taking the medication as prescribed and denies adverse side effects. Denies fatigue, temperature intolerances, or changes to skin/hair/nails.   Lab Results  Component Value Date   TSH 3.18 10/15/2015    2.) Hypertriglyceridemia - Current maintained on fenofibrate. Reports taking the medication as prescribed and denies adverse side effects. Working on nutrition and some physical activity.    Lab Results  Component Value Date   CHOL 173 10/15/2015   HDL 22.60* 10/15/2015   LDLDIRECT 58.0 10/15/2015   TRIG * 10/15/2015    456.0 Triglyceride is over 400; calculations on Lipids are invalid.   CHOLHDL 8 10/15/2015    No Known Allergies   Current Outpatient Prescriptions on File Prior to Visit  Medication Sig Dispense Refill  . fenofibrate (TRICOR) 145 MG tablet Take 1 tablet (145 mg total) by mouth daily. 90 tablet 3  . levothyroxine (SYNTHROID, LEVOTHROID) 50 MCG tablet Take 0.5 tablets (25 mcg total) by mouth daily before breakfast. 30 tablet 1   No current facility-administered medications on file prior to visit.    Review of Systems  Constitutional: Negative for fever and chills.  Respiratory: Negative for chest tightness and shortness of breath.   Cardiovascular: Negative for chest pain, palpitations and leg swelling.      Objective:    BP 114/78 mmHg  Pulse 105  Temp(Src) 98.1 F (36.7 C) (Oral)  Resp 14  Ht 5\' 11"  (1.803 m)  Wt 228 lb (103.42 kg)  BMI 31.81  kg/m2  SpO2 97% Nursing note and vital signs reviewed.  Physical Exam  Constitutional: He is oriented to person, place, and time. He appears well-developed and well-nourished. No distress.  Cardiovascular: Normal rate, regular rhythm, normal heart sounds and intact distal pulses.   Pulmonary/Chest: Effort normal and breath sounds normal.  Neurological: He is alert and oriented to person, place, and time.  Skin: Skin is warm and dry.  Psychiatric: He has a normal mood and affect. His behavior is normal. Judgment and thought content normal.       Assessment & Plan:   Problem List Items Addressed This Visit      Endocrine   Hypothyroidism - Primary    Hypothyroidism appears stable with current dosage of levothyroxine although some fatigue continues to exist. Patient would like to increase medication dosage pending TSH results. Continue current dosage of levothyroxine pending results.      Relevant Orders   TSH     Other   Hypertriglyceridemia    Obtain lipid profile to check current status. Stable with fenofibrate. Continues to work on nutrition and physical activity. Continue current dosage of fenofibrate pending lipid profile results. Consider addition of statin if triglycerides remain elevated and HDL remains low.          I have discontinued Mr. Grayce Sessionsotkay's Naproxen-Esomeprazole, Diclofenac Sodium, and tiZANidine. I am also having him maintain his fenofibrate and levothyroxine.   Follow-up: Pending blood work   Jeanine Luzalone, Whittney Steenson, FNP

## 2016-01-16 ENCOUNTER — Other Ambulatory Visit: Payer: Self-pay | Admitting: Family

## 2016-01-16 ENCOUNTER — Encounter: Payer: Self-pay | Admitting: Family

## 2016-01-16 DIAGNOSIS — E039 Hypothyroidism, unspecified: Secondary | ICD-10-CM

## 2016-01-16 LAB — LUTEINIZING HORMONE: LH: 3.2 m[IU]/mL (ref 1.50–9.30)

## 2016-01-16 MED ORDER — LEVOTHYROXINE SODIUM 75 MCG PO TABS: 75.0000 ug | ORAL_TABLET | Freq: Every day | ORAL | Status: DC

## 2016-01-22 MED ORDER — ROSUVASTATIN CALCIUM 10 MG PO TABS: 10.0000 mg | ORAL_TABLET | Freq: Every day | ORAL | Status: DC

## 2016-10-22 ENCOUNTER — Other Ambulatory Visit: Payer: Self-pay | Admitting: Family

## 2016-10-22 DIAGNOSIS — E039 Hypothyroidism, unspecified: Secondary | ICD-10-CM

## 2016-10-22 DIAGNOSIS — E781 Pure hyperglyceridemia: Secondary | ICD-10-CM

## 2017-04-23 ENCOUNTER — Encounter: Payer: Self-pay | Admitting: Family

## 2017-04-23 ENCOUNTER — Other Ambulatory Visit (INDEPENDENT_AMBULATORY_CARE_PROVIDER_SITE_OTHER): Payer: 59

## 2017-04-23 ENCOUNTER — Ambulatory Visit (INDEPENDENT_AMBULATORY_CARE_PROVIDER_SITE_OTHER): Payer: 59 | Admitting: Family

## 2017-04-23 VITALS — BP 130/82 | HR 99 | Temp 98.1°F | Resp 16 | Ht 71.0 in | Wt 231.0 lb

## 2017-04-23 DIAGNOSIS — R41840 Attention and concentration deficit: Secondary | ICD-10-CM

## 2017-04-23 DIAGNOSIS — E039 Hypothyroidism, unspecified: Secondary | ICD-10-CM

## 2017-04-23 DIAGNOSIS — E781 Pure hyperglyceridemia: Secondary | ICD-10-CM | POA: Diagnosis not present

## 2017-04-23 DIAGNOSIS — Z0000 Encounter for general adult medical examination without abnormal findings: Secondary | ICD-10-CM | POA: Diagnosis not present

## 2017-04-23 DIAGNOSIS — E66811 Obesity, class 1: Secondary | ICD-10-CM

## 2017-04-23 DIAGNOSIS — Z6832 Body mass index (BMI) 32.0-32.9, adult: Secondary | ICD-10-CM

## 2017-04-23 DIAGNOSIS — E6609 Other obesity due to excess calories: Secondary | ICD-10-CM

## 2017-04-23 DIAGNOSIS — R7989 Other specified abnormal findings of blood chemistry: Secondary | ICD-10-CM | POA: Diagnosis not present

## 2017-04-23 LAB — CBC
HEMATOCRIT: 44 % (ref 39.0–52.0)
Hemoglobin: 15.6 g/dL (ref 13.0–17.0)
MCHC: 35.4 g/dL (ref 30.0–36.0)
MCV: 90.2 fl (ref 78.0–100.0)
Platelets: 276 10*3/uL (ref 150.0–400.0)
RBC: 4.88 Mil/uL (ref 4.22–5.81)
RDW: 12.5 % (ref 11.5–15.5)
WBC: 5.5 10*3/uL (ref 4.0–10.5)

## 2017-04-23 LAB — LIPID PANEL
Cholesterol: 162 mg/dL (ref 0–200)
HDL: 20.8 mg/dL — ABNORMAL LOW (ref >39.00)
Total CHOL/HDL Ratio: 8
Triglycerides: 429 mg/dL — ABNORMAL HIGH (ref 0.0–149.0)

## 2017-04-23 LAB — COMPREHENSIVE METABOLIC PANEL
ALK PHOS: 79 U/L (ref 39–117)
ALT: 43 U/L (ref 0–53)
AST: 21 U/L (ref 0–37)
Albumin: 4.9 g/dL (ref 3.5–5.2)
BUN: 12 mg/dL (ref 6–23)
CO2: 25 mEq/L (ref 19–32)
CREATININE: 0.87 mg/dL (ref 0.40–1.50)
Calcium: 10 mg/dL (ref 8.4–10.5)
Chloride: 105 mEq/L (ref 96–112)
GFR: 111.8 mL/min (ref >60.00)
GLUCOSE: 101 mg/dL — AB (ref 70–99)
POTASSIUM: 3.7 meq/L (ref 3.5–5.1)
SODIUM: 141 meq/L (ref 135–145)
TOTAL PROTEIN: 7.8 g/dL (ref 6.0–8.3)
Total Bilirubin: 0.5 mg/dL (ref 0.2–1.2)

## 2017-04-23 LAB — TSH: TSH: 2.36 u[IU]/mL (ref 0.35–4.50)

## 2017-04-23 LAB — LDL CHOLESTEROL, DIRECT: LDL DIRECT: 60 mg/dL

## 2017-04-23 NOTE — Assessment & Plan Note (Signed)
Stable with current medication regimen. Question patient compliance based on refill pattern. Obtain TSH. Continue current dosage of levothyroxine pending TSH results.

## 2017-04-23 NOTE — Progress Notes (Signed)
Subjective:    Patient ID: Gerald Watts, male    DOB: 10/23/1990, 26 y.o.   MRN: [id number]  Chief Complaint  Patient presents with  . CPE    fasting    HPI:  Gerald Watts is a 26 y.o. male who presents today for an annual wellness visit.   1) Health Maintenance -   Diet - Averages about 2 meals per day consisting of a regular diet; Caffeine intake of about 5-6 cups per day  Exercise - [employer] 2x per week; mixture of cardio and resistance   2) Preventative Exams / Immunizations:  Dental -- Up to date  Vision -- Up to date   Health Maintenance  Topic Date Due  . HIV Screening  03/22/2005  . INFLUENZA VACCINE  06/01/2017 (Originally 03/18/2017)  . TETANUS/TDAP  09/18/2024    Immunization History  Administered Date(s) Administered  . Influenza,inj,Quad PF,6+ Mos 04/19/2015  . Td 09/18/2014     No Known Allergies   Outpatient Medications Prior to Visit  Medication Sig Dispense Refill  . fenofibrate (TRICOR) 145 MG tablet Take 1 tablet (145 mg total) by mouth daily. Yearly physical w/labs due in May must have appt for future refills 90 tablet 0  . levothyroxine (SYNTHROID, LEVOTHROID) 75 MCG tablet Take 1 tablet (75 mcg total) by mouth daily before breakfast. Yearly physical w/labs due in May must have appt for future refills 90 tablet 0  . rosuvastatin (CRESTOR) 10 MG tablet Take 1 tablet (10 mg total) by mouth daily. Yearly physical w/labs due in May must have appt for future refills 90 tablet 0   No facility-administered medications prior to visit.      Past Medical History:  Diagnosis Date  . Erectile dysfunction 03/19/2015  . Hypertriglyceridemia 08/07/2014  . Hypothyroidism 04/19/2015     Past Surgical History:  Procedure Laterality Date  . WISDOM TOOTH EXTRACTION       Family History  Problem Relation Age of Onset  . Drug abuse Mother        Prescription medications  . Hypertension Mother   . Lung cancer Mother      Social History    Social History  . Marital status: Married    Spouse name: N/A  . Number of children: N/A  . Years of education: 6816   Occupational History  . Geologist, engineeringLogistics Business Analyst    Social History Main Topics  . Smoking status: Never Smoker  . Smokeless tobacco: Never Used  . Alcohol use Yes     Comment: Occasional  . Drug use: No  . Sexual activity: Not on file   Other Topics Concern  . Not on file   Social History Narrative   [place] Currently resides in an apartment with roommate. 2 dogs. Fun: Sleep, would like to skydive   Denies any religious beliefs that would effect healthcare.       Review of Systems  Constitutional: Denies fever, chills, fatigue, or significant weight gain/loss. HENT: Head: Denies headache or neck pain Ears: Denies changes in hearing, ringing in ears, earache, drainage Nose: Denies discharge, stuffiness, itching, nosebleed, sinus pain Throat: Denies sore throat, hoarseness, dry mouth, sores, thrush Eyes: Denies loss/changes in vision, pain, redness, blurry/double vision, flashing lights Cardiovascular: Denies chest pain/discomfort, tightness, palpitations, shortness of breath with activity, difficulty lying down, swelling, sudden awakening with shortness of breath Respiratory: Denies shortness of breath, cough, sputum production, wheezing Gastrointestinal: Denies dysphasia, heartburn, change in appetite, nausea, change  in bowel habits, rectal bleeding, constipation, diarrhea, yellow skin or eyes Genitourinary: Denies frequency, urgency, burning/pain, blood in urine, incontinence, change in urinary strength. Musculoskeletal: Denies muscle/joint pain, stiffness, back pain, redness or swelling of joints, trauma Skin: Denies rashes, lumps, itching, dryness, color changes, or hair/nail changes Neurological: Denies dizziness, fainting, seizures, weakness, numbness, tingling, tremor Psychiatric - Denies nervousness, stress, depression or  memory loss Endocrine: Denies heat or cold intolerance, sweating, frequent urination, excessive thirst, changes in appetite Hematologic: Denies ease of bruising or bleeding     Objective:     BP 130/82 (BP Location: Left Arm, Patient Position: Sitting, Cuff Size: Large)   Pulse 99   Temp 98.1 F (36.7 C) (Oral)   Resp 16   Ht  (1.803 m)   Wt 231 lb (104.8 kg)   SpO2 96%   BMI 32.22 kg/m  Nursing note and vital signs reviewed.  Physical Exam  Constitutional: He is oriented to person, place, and time. He appears well-developed and well-nourished.  HENT:  Head: Normocephalic.  Right Ear: Hearing, tympanic membrane, external ear and ear canal normal.  Left Ear: Hearing, tympanic membrane, external ear and ear canal normal.  Nose: Nose normal.  Mouth/Throat: Uvula is midline, oropharynx is clear and moist and mucous membranes are normal.  Eyes: Pupils are equal, round, and reactive to light. Conjunctivae and EOM are normal.  Neck: Neck supple. No JVD present. No tracheal deviation present. No thyromegaly present.  Cardiovascular: Normal rate, regular rhythm, normal heart sounds and intact distal pulses.   Pulmonary/Chest: Effort normal and breath sounds normal.  Abdominal: Soft. Bowel sounds are normal. He exhibits no distension and no mass. There is no tenderness. There is no rebound and no guarding.  Musculoskeletal: Normal range of motion. He exhibits no edema or tenderness.  Lymphadenopathy:    He has no cervical adenopathy.  Neurological: He is alert and oriented to person, place, and time. He has normal reflexes. No cranial nerve deficit. He exhibits normal muscle tone. Coordination normal.  Skin: Skin is warm and dry.  Psychiatric: He has a normal mood and affect. His behavior is normal. Judgment and thought content normal.       Assessment & Plan:   Problem List Items Addressed This Visit      Endocrine   Hypothyroidism    Stable with current medication  regimen. Question patient compliance based on refill pattern. Obtain TSH. Continue current dosage of levothyroxine pending TSH results.         Other   Hypertriglyceridemia    Appears stable with Crestor and Tricor. Obtain lipid profile. Continue current dosage of Crestor and Tricor pending lipid profile results.       Routine general medical examination at a health care facility - Primary    1) Anticipatory Guidance: Discussed importance of wearing a seatbelt while driving and not texting while driving; changing batteries in smoke detector at least once annually; wearing suntan lotion when outside; eating a balanced and moderate diet; getting physical activity at least 30 minutes per day.  2) Immunizations / Screenings / Labs:  Declines influenza and notes he will get it through work. All other immunizations are up to date. Declines HIV screening. All other screenings are up to date per recommendations. Obtain CBC, TSH, CMET, and lipid profile.    Overall well exam with risk factors for cardiovascular and chronic diseases including hyperlipidemia and obesity. Chronic conditions appear adequately controlled when compliant with medication regimen. Recommend weight loss of 5-10%  of current body weight through nutrition and physical activity. Continue other healthy lifestyle behaviors and choices. Follow up prevention exam in 1 year. Follow up office visit pending blood work and for chronic conditions.        Relevant Orders   CBC   Comprehensive metabolic panel   Lipid panel   TSH   Obesity    BMI of 32. Recommend weight loss of 5-10% of current body weight. Recommend increasing physical activity to 30 minutes of moderate level activity daily. Encourage nutritional intake that focuses on nutrient dense foods and is moderate, varied, and balanced and is low in saturated fats and processed/sugary foods. Continue to monitor.       Lack of concentration   Relevant Orders   Ambulatory  referral to Psychology      I am having Mr. Cahoon maintain his levothyroxine, rosuvastatin, and fenofibrate.   Follow-up: Return if symptoms worsen or fail to improve.   Jeanine Luz, FNP

## 2017-04-23 NOTE — Assessment & Plan Note (Signed)
1) Anticipatory Guidance: Discussed importance of wearing a seatbelt while driving and not texting while driving; changing batteries in smoke detector at least once annually; wearing suntan lotion when outside; eating a balanced and moderate diet; getting physical activity at least 30 minutes per day.  2) Immunizations / Screenings / Labs:  Declines influenza and notes he will get it through work. All other immunizations are up to date. Declines HIV screening. All other screenings are up to date per recommendations. Obtain CBC, TSH, CMET, and lipid profile.    Overall well exam with risk factors for cardiovascular and chronic diseases including hyperlipidemia and obesity. Chronic conditions appear adequately controlled when compliant with medication regimen. Recommend weight loss of 5-10% of current body weight through nutrition and physical activity. Continue other healthy lifestyle behaviors and choices. Follow up prevention exam in 1 year. Follow up office visit pending blood work and for chronic conditions.

## 2017-04-23 NOTE — Assessment & Plan Note (Signed)
BMI of 32. Recommend weight loss of 5-10% of current body weight. Recommend increasing physical activity to 30 minutes of moderate level activity daily. Encourage nutritional intake that focuses on nutrient dense foods and is moderate, varied, and balanced and is low in saturated fats and processed/sugary foods. Continue to monitor.  

## 2017-04-23 NOTE — Assessment & Plan Note (Signed)
Appears stable with Crestor and Tricor. Obtain lipid profile. Continue current dosage of Crestor and Tricor pending lipid profile results.

## 2017-04-23 NOTE — Patient Instructions (Addendum)
Thank you for choosing [facility].  SUMMARY AND INSTRUCTIONS:  Continue to take your medications as prescribed.   We will check your blood work today.   Labs:  Please stop by the lab on the lower level of the building for your blood work. Your results will be released to MyChart (or called to you) after review, usually within 72 hours after test completion. If any changes need to be made, you will be notified at that same time.  1.) The lab is open from 7:30am to 5:30 pm Monday-Friday 2.) No appointment is necessary 3.) Fasting (if needed) is 6-8 hours after food and drink; black coffee and water are okay   Follow up:  If your symptoms worsen or fail to improve, please contact our office for further instruction, or in case of emergency go directly to the emergency room at the closest medical facility.    Health Maintenance, Male A healthy lifestyle and preventive care is important for your health and wellness. Ask your health care provider about what schedule of regular examinations is right for you. What should I know about weight and diet? Eat a Healthy Diet  Eat plenty of vegetables, fruits, whole grains, low-fat dairy products, and lean protein.  Do not eat a lot of foods high in solid fats, added sugars, or salt.  Maintain a Healthy Weight Regular exercise can help you achieve or maintain a healthy weight. You should:  Do at least 150 minutes of exercise each week. The exercise should increase your heart rate and make you sweat (moderate-intensity exercise).  Do strength-training exercises at least twice a week.  Watch Your Levels of Cholesterol and Blood Lipids  Have your blood tested for lipids and cholesterol every 5 years starting at 27 years of age. If you are at high risk for heart disease, you should start having your blood tested when you are 27 years old. You may need to have your cholesterol levels checked more often if: ? Your lipid or cholesterol  levels are high. ? You are older than 27 years of age. ? You are at high risk for heart disease.  What should I know about cancer screening? Many types of cancers can be detected early and may often be prevented. Lung Cancer  You should be screened every year for lung cancer if: ? You are a current smoker who has smoked for at least 30 years. ? You are a former smoker who has quit within the past 15 years.  Talk to your health care provider about your screening options, when you should start screening, and how often you should be screened.  Colorectal Cancer  Routine colorectal cancer screening usually begins at 27 years of age and should be repeated every 5-10 years until you are 27 years old. You may need to be screened more often if early forms of precancerous polyps or small growths are found. Your health care provider may recommend screening at an earlier age if you have risk factors for colon cancer.  Your health care provider may recommend using home test kits to check for hidden blood in the stool.  A small camera at the end of a tube can be used to examine your colon (sigmoidoscopy or colonoscopy). This checks for the earliest forms of colorectal cancer.  Prostate and Testicular Cancer  Depending on your age and overall health, your health care provider may do certain tests to screen for prostate and testicular cancer.  Talk to your health care provider  about any symptoms or concerns you have about testicular or prostate cancer.  Skin Cancer  Check your skin from head to toe regularly.  Tell your health care provider about any new moles or changes in moles, especially if: ? There is a change in a mole's size, shape, or color. ? You have a mole that is larger than a pencil eraser.  Always use sunscreen. Apply sunscreen liberally and repeat throughout the day.  Protect yourself by wearing long sleeves, pants, a wide-brimmed hat, and sunglasses when outside.  What should  I know about heart disease, diabetes, and high blood pressure?  If you are 74-67 years of age, have your blood pressure checked every 3-5 years. If you are 21 years of age or older, have your blood pressure checked every year. You should have your blood pressure measured twice-once when you are at a hospital or clinic, and once when you are not at a hospital or clinic. Record the average of the two measurements. To check your blood pressure when you are not at a hospital or clinic, you can use: ? An automated blood pressure machine at a pharmacy. ? A home blood pressure monitor.  Talk to your health care provider about your target blood pressure.  If you are between 61-66 years old, ask your health care provider if you should take aspirin to prevent heart disease.  Have regular diabetes screenings by checking your fasting blood sugar level. ? If you are at a normal weight and have a low risk for diabetes, have this test once every three years after the age of 74. ? If you are overweight and have a high risk for diabetes, consider being tested at a younger age or more often.  A one-time screening for abdominal aortic aneurysm (AAA) by ultrasound is recommended for men aged 5-75 years who are current or former smokers. What should I know about preventing infection? Hepatitis B If you have a higher risk for hepatitis B, you should be screened for this virus. Talk with your health care provider to find out if you are at risk for hepatitis B infection. Hepatitis C Blood testing is recommended for:  Everyone born from [birth year] through 1966.  Anyone with known risk factors for hepatitis C.  Sexually Transmitted Diseases (STDs)  You should be screened each year for STDs including gonorrhea and chlamydia if: ? You are sexually active and are younger than 27 years of age. ? You are older than 27 years of age and your health care provider tells you that you are at risk for this type of  infection. ? Your sexual activity has changed since you were last screened and you are at an increased risk for chlamydia or gonorrhea. Ask your health care provider if you are at risk.  Talk with your health care provider about whether you are at high risk of being infected with HIV. Your health care provider may recommend a prescription medicine to help prevent HIV infection.  What else can I do?  Schedule regular health, dental, and eye exams.  Stay current with your vaccines (immunizations).  Do not use any tobacco products, such as cigarettes, chewing tobacco, and e-cigarettes. If you need help quitting, ask your health care provider.  Limit alcohol intake to no more than 2 drinks per day. One drink equals 12 ounces of beer, 5 ounces of wine, or 1 ounces of hard liquor.  Do not use street drugs.  Do not share needles.  Ask your  health care provider for help if you need support or information about quitting drugs.  Tell your health care provider if you often feel depressed.  Tell your health care provider if you have ever been abused or do not feel safe at home. This information is not intended to replace advice given to you by your health care provider. Make sure you discuss any questions you have with your health care provider. Document Released: 01/31/2008 Document Revised: 04/02/2016 Document Reviewed: 05/08/2015 Elsevier Interactive Patient Education  [employer].

## 2017-04-28 ENCOUNTER — Other Ambulatory Visit: Payer: Self-pay | Admitting: Family

## 2017-04-28 DIAGNOSIS — E781 Pure hyperglyceridemia: Secondary | ICD-10-CM

## 2017-05-06 MED ORDER — FENOFIBRATE 160 MG PO TABS: 160.0000 mg | ORAL_TABLET | Freq: Every day | ORAL | 2 refills | Status: DC

## 2017-05-07 ENCOUNTER — Other Ambulatory Visit: Payer: Self-pay

## 2017-05-07 MED ORDER — FENOFIBRATE 160 MG PO TABS: 160.0000 mg | ORAL_TABLET | Freq: Every day | ORAL | 0 refills | Status: DC

## 2017-06-05 ENCOUNTER — Ambulatory Visit (INDEPENDENT_AMBULATORY_CARE_PROVIDER_SITE_OTHER): Payer: 59 | Admitting: Psychology

## 2017-06-05 DIAGNOSIS — G4710 Hypersomnia, unspecified: Secondary | ICD-10-CM | POA: Diagnosis not present

## 2017-06-05 DIAGNOSIS — F3341 Major depressive disorder, recurrent, in partial remission: Secondary | ICD-10-CM

## 2017-06-05 DIAGNOSIS — F4010 Social phobia, unspecified: Secondary | ICD-10-CM

## 2017-07-15 ENCOUNTER — Ambulatory Visit: Payer: 59 | Admitting: Psychology

## 2017-07-15 DIAGNOSIS — F4010 Social phobia, unspecified: Secondary | ICD-10-CM | POA: Diagnosis not present

## 2017-07-15 DIAGNOSIS — F900 Attention-deficit hyperactivity disorder, predominantly inattentive type: Secondary | ICD-10-CM | POA: Diagnosis not present

## 2017-07-15 DIAGNOSIS — F330 Major depressive disorder, recurrent, mild: Secondary | ICD-10-CM

## 2017-07-15 DIAGNOSIS — G4710 Hypersomnia, unspecified: Secondary | ICD-10-CM

## 2017-07-20 ENCOUNTER — Telehealth: Payer: Self-pay | Admitting: Family

## 2017-07-20 DIAGNOSIS — E039 Hypothyroidism, unspecified: Secondary | ICD-10-CM

## 2017-07-20 MED ORDER — LEVOTHYROXINE SODIUM 75 MCG PO TABS: 75.0000 ug | ORAL_TABLET | Freq: Every day | ORAL | 0 refills | Status: DC

## 2017-07-20 NOTE — Telephone Encounter (Signed)
Copied from CRM [id number]. Topic: Quick Communication - Rx Refill/Question >> Jul 20, 2017  9:44 AM Crist InfanteHarrald, Kathy J wrote: Pharmacy called for pt to request a refill of 90 day supply levothyroxine (SYNTHROID, LEVOTHROID) 75 MCG tablet  [facility] [place] [facility] [id number] [phone] (Phone) [phone] (Fax)  Ref no: [id number]  Pt used to be a pt of [facility], but had his cpe in Sept 2018

## 2017-07-20 NOTE — Telephone Encounter (Signed)
Not  Sure  Who  Should  Refill   This  Med  As   [facility]   No  Longer   Works  For this  Office  And  Pt  Has  No  Appointment ?

## 2017-07-20 NOTE — Telephone Encounter (Deleted)
Copied from CRM [id number]. Topic: Quick Communication - Rx Refill/Question >> Jul 20, 2017  9:44 AM Crist InfanteHarrald, Kathy J wrote: Pharmacy called for pt to request a refill of 90 day supply levothyroxine (SYNTHROID, LEVOTHROID) 75 MCG tablet  [facility] [place] [place] - [id number] [phone] (Phone) [phone] (Fax)  Ref no: [id number]

## 2017-07-20 NOTE — Telephone Encounter (Signed)
Patient has made appt with Gerald Watts.

## 2017-07-21 ENCOUNTER — Other Ambulatory Visit: Payer: Self-pay | Admitting: *Deleted

## 2017-07-21 DIAGNOSIS — E039 Hypothyroidism, unspecified: Secondary | ICD-10-CM

## 2017-07-21 MED ORDER — LEVOTHYROXINE SODIUM 75 MCG PO TABS: 75.0000 ug | ORAL_TABLET | Freq: Every day | ORAL | 0 refills | Status: DC

## 2017-07-21 MED ORDER — ROSUVASTATIN CALCIUM 10 MG PO TABS: 10.0000 mg | ORAL_TABLET | Freq: Every day | ORAL | 0 refills | Status: DC

## 2017-07-21 NOTE — Telephone Encounter (Signed)
Pt has schedule appt w/Dr. Jonny RuizJohn for 09/22/17. Per office policy sent enough med until appt...Raechel Chute/lmb

## 2017-08-27 ENCOUNTER — Ambulatory Visit: Payer: 59 | Admitting: Psychology

## 2017-09-15 ENCOUNTER — Ambulatory Visit: Payer: 59 | Admitting: Psychology

## 2017-09-15 DIAGNOSIS — F330 Major depressive disorder, recurrent, mild: Secondary | ICD-10-CM

## 2017-09-15 DIAGNOSIS — F900 Attention-deficit hyperactivity disorder, predominantly inattentive type: Secondary | ICD-10-CM

## 2017-09-15 DIAGNOSIS — F4010 Social phobia, unspecified: Secondary | ICD-10-CM | POA: Diagnosis not present

## 2017-09-22 ENCOUNTER — Encounter: Payer: Self-pay | Admitting: Internal Medicine

## 2017-09-22 ENCOUNTER — Ambulatory Visit: Payer: 59 | Admitting: Internal Medicine

## 2017-09-22 VITALS — BP 124/78 | HR 94 | Temp 98.3°F | Ht 71.0 in | Wt 226.0 lb

## 2017-09-22 DIAGNOSIS — F419 Anxiety disorder, unspecified: Secondary | ICD-10-CM | POA: Diagnosis not present

## 2017-09-22 DIAGNOSIS — E039 Hypothyroidism, unspecified: Secondary | ICD-10-CM

## 2017-09-22 DIAGNOSIS — F329 Major depressive disorder, single episode, unspecified: Secondary | ICD-10-CM | POA: Diagnosis not present

## 2017-09-22 DIAGNOSIS — G4710 Hypersomnia, unspecified: Secondary | ICD-10-CM | POA: Insufficient documentation

## 2017-09-22 DIAGNOSIS — E781 Pure hyperglyceridemia: Secondary | ICD-10-CM | POA: Diagnosis not present

## 2017-09-22 DIAGNOSIS — F32A Depression, unspecified: Secondary | ICD-10-CM | POA: Insufficient documentation

## 2017-09-22 DIAGNOSIS — F909 Attention-deficit hyperactivity disorder, unspecified type: Secondary | ICD-10-CM | POA: Diagnosis not present

## 2017-09-22 MED ORDER — FENOFIBRATE 160 MG PO TABS: 160.0000 mg | ORAL_TABLET | Freq: Every day | ORAL | 3 refills | Status: DC

## 2017-09-22 MED ORDER — AMPHETAMINE-DEXTROAMPHETAMINE 10 MG PO TABS: 10.0000 mg | ORAL_TABLET | Freq: Every day | ORAL | 0 refills | Status: DC

## 2017-09-22 MED ORDER — AMPHETAMINE-DEXTROAMPHET ER 20 MG PO CP24: 20.0000 mg | ORAL_CAPSULE | ORAL | 0 refills | Status: DC

## 2017-09-22 MED ORDER — LEVOTHYROXINE SODIUM 75 MCG PO TABS: 75.0000 ug | ORAL_TABLET | Freq: Every day | ORAL | 3 refills | Status: DC

## 2017-09-22 NOTE — Assessment & Plan Note (Signed)
To restart med, f/u lab in 4 wks

## 2017-09-22 NOTE — Assessment & Plan Note (Signed)
With hx of neg sleep study, likely to improve with adderall for ADHD, will cont to follow

## 2017-09-22 NOTE — Progress Notes (Signed)
   Subjective:    Patient ID: Gerald Watts, male    DOB: 09/25/1990, 27 y.o.   MRN: [id number]  HPI    Here to f/u; overall doing ok,  Pt denies chest pain, increasing sob or doe, wheezing, orthopnea, PND, increased LE swelling, palpitations, dizziness or syncope.  Pt denies new neurological symptoms such as new headache, or facial or extremity weakness or numbness.  Pt denies polydipsia, polyuria, or low sugar episode.  Pt states overall good compliance with meds, mostly trying to follow appropriate diet, with wt overall stable,  but little exercise however. Does c/o ongoing fatigue, and continues significant daytime hypersomnolence.  Did have neg sleep test 2 yrs ago; to see ENT next wk for snoring at wife's suggestion.  Has also seen [place] [facility] with new dx: ADHD, and mild anxiety with depression.  Denies hyper or hypo thyroid symptoms such as voice, skin or hair change.  Has not taken meds in the last month as he had run out.   Past Medical History:  Diagnosis Date  . Hypertriglyceridemia 08/07/2014  . Hypothyroidism 04/19/2015   Past Surgical History:  Procedure Laterality Date  . WISDOM TOOTH EXTRACTION      reports that  has never smoked. he has never used smokeless tobacco. He reports that he drinks alcohol. He reports that he does not use drugs. family history includes Drug abuse in his mother; Hypertension in his mother; Lung cancer in his mother. No Known Allergies No current outpatient medications on file prior to visit.   No current facility-administered medications on file prior to visit.    Review of Systems  Constitutional: Negative for other unusual diaphoresis or sweats HENT: Negative for ear discharge or swelling Eyes: Negative for other worsening visual disturbances Respiratory: Negative for stridor or other swelling  Gastrointestinal: Negative for worsening distension or other blood Genitourinary: Negative for retention or other urinary change Musculoskeletal:  Negative for other MSK pain or swelling Skin: Negative for color change or other new lesions Neurological: Negative for worsening tremors and other numbness  Psychiatric/Behavioral: Negative for worsening agitation or other fatigue All other system neg per pt    Objective:   Physical Exam BP 124/78   Pulse 94   Temp 98.3 F (36.8 C) (Oral)   Ht 5\' 11"  (1.803 m)   Wt 226 lb (102.5 kg)   SpO2 100%   BMI 31.52 kg/m  VS noted, obese Constitutional: Pt appears in NAD HENT: Head: NCAT.  Right Ear: External ear normal.  Left Ear: External ear normal.  Eyes: . Pupils are equal, round, and reactive to light. Conjunctivae and EOM are normal Nose: without d/c or deformity Neck: Neck supple. Gross normal ROM Cardiovascular: Normal rate and regular rhythm.   Pulmonary/Chest: Effort normal and breath sounds without rales or wheezing.  Abd:  Soft, NT, ND, + BS, no organomegaly Neurological: Pt is alert. At baseline orientation, motor grossly intact Skin: Skin is warm. No rashes, other new lesions, no LE edema Psychiatric: Pt behavior is normal without agitation  No other exam findings       Assessment & Plan:

## 2017-09-22 NOTE — Assessment & Plan Note (Signed)
Recent dx, for adderall xr 20 am and adderall 10 at 2 pm, to f/u any worsening symptoms or concerns

## 2017-09-22 NOTE — Assessment & Plan Note (Signed)
To restart fenofibrate, ok to d/c crestor for now, f/u lab in 4 wks

## 2017-09-22 NOTE — Patient Instructions (Addendum)
OK to stop the crestor for now  Please take all new medication as prescribed - the Adderall ER 20 mg in the AM, then 10 mg of the "regular" adderall in the afternoon  Please continue all other medications as before, and refills have been done if requested.  Please have the pharmacy call with any other refills you may need.  Please continue your efforts at being more active, low cholesterol diet, and weight control.  Please keep your appointments with your specialists as you may have planned - ENT next week  Please go to the LAB in the Basement (turn left off the elevator) for the tests to be done IN 4 Weeks  You will be contacted by phone if any changes need to be made immediately.  Otherwise, you will receive a letter about your results with an explanation, but please check with MyChart first.  Please remember to sign up for MyChart if you have not done so, as this will be important to you in the future with finding out test results, communicating by private email, and scheduling acute appointments online when needed.  Please return in 1 year for your yearly visit, or sooner if needed, with Lab testing done 3-5 days before

## 2017-09-22 NOTE — Assessment & Plan Note (Signed)
Mild, declines other specific tx at this time such as SSRI or referral

## 2017-09-23 ENCOUNTER — Encounter: Payer: Self-pay | Admitting: Internal Medicine

## 2017-10-19 ENCOUNTER — Ambulatory Visit: Payer: 59 | Admitting: Psychology

## 2017-10-19 DIAGNOSIS — F900 Attention-deficit hyperactivity disorder, predominantly inattentive type: Secondary | ICD-10-CM | POA: Diagnosis not present

## 2017-10-19 DIAGNOSIS — F4010 Social phobia, unspecified: Secondary | ICD-10-CM | POA: Diagnosis not present

## 2017-10-19 DIAGNOSIS — F330 Major depressive disorder, recurrent, mild: Secondary | ICD-10-CM | POA: Diagnosis not present

## 2017-10-30 ENCOUNTER — Other Ambulatory Visit: Payer: Self-pay | Admitting: Internal Medicine

## 2017-11-01 MED ORDER — AMPHETAMINE-DEXTROAMPHET ER 20 MG PO CP24: 20.0000 mg | ORAL_CAPSULE | ORAL | 0 refills | Status: DC

## 2017-11-01 MED ORDER — AMPHETAMINE-DEXTROAMPHETAMINE 10 MG PO TABS: 10.0000 mg | ORAL_TABLET | Freq: Every day | ORAL | 0 refills | Status: DC

## 2017-11-01 NOTE — Telephone Encounter (Signed)
Done erx 

## 2017-11-18 ENCOUNTER — Emergency Department (HOSPITAL_COMMUNITY)
Admission: EM | Admit: 2017-11-18 | Discharge: 2017-11-18 | Disposition: A | Discharge status: Home / Self Care | Payer: 59 | Attending: Emergency Medicine | Admitting: Emergency Medicine

## 2017-11-18 ENCOUNTER — Encounter (HOSPITAL_COMMUNITY): Payer: Self-pay | Admitting: Family Medicine

## 2017-11-18 DIAGNOSIS — J029 Acute pharyngitis, unspecified: Secondary | ICD-10-CM | POA: Diagnosis present

## 2017-11-18 DIAGNOSIS — G8918 Other acute postprocedural pain: Secondary | ICD-10-CM | POA: Diagnosis not present

## 2017-11-18 DIAGNOSIS — Z79899 Other long term (current) drug therapy: Secondary | ICD-10-CM | POA: Diagnosis not present

## 2017-11-18 DIAGNOSIS — E039 Hypothyroidism, unspecified: Secondary | ICD-10-CM | POA: Insufficient documentation

## 2017-11-18 MED ORDER — SODIUM CHLORIDE 0.9 % IV SOLN: INTRAVENOUS | Status: DC

## 2017-11-18 MED ORDER — SODIUM CHLORIDE 0.9 % IV BOLUS
2000.0000 mL | Freq: Once | INTRAVENOUS | Status: AC
  Administered 2017-11-18: 2000 mL via INTRAVENOUS

## 2017-11-18 MED ORDER — DEXAMETHASONE SODIUM PHOSPHATE 10 MG/ML IJ SOLN
10.0000 mg | Freq: Once | INTRAMUSCULAR | Status: AC
  Administered 2017-11-18: 10 mg via INTRAVENOUS
  Filled 2017-11-18: qty 1

## 2017-11-18 MED ORDER — MORPHINE SULFATE (PF) 4 MG/ML IV SOLN
4.0000 mg | Freq: Once | INTRAVENOUS | Status: AC
  Administered 2017-11-18: 4 mg via INTRAVENOUS
  Filled 2017-11-18: qty 1

## 2017-11-18 MED ORDER — ONDANSETRON HCL 4 MG/2ML IJ SOLN
4.0000 mg | Freq: Once | INTRAMUSCULAR | Status: AC
  Administered 2017-11-18: 4 mg via INTRAVENOUS
  Filled 2017-11-18: qty 2

## 2017-11-18 NOTE — ED Notes (Signed)
Called EMT for Dr Freida BusmanAllen @[phone] 

## 2017-11-18 NOTE — ED Triage Notes (Signed)
Patient had a tonsillectomy, adenoidectomy, and uvulectomy last Thursday. He is complaining of the intense pain, copious amounts of mucous with blood. Patients airway is patent and respirations are even, regular, and unlabored.

## 2017-11-18 NOTE — ED Provider Notes (Signed)
[facility]-EMERGENCY DEPT Provider Note   CSN: [id number] Arrival date & time: 11/18/17  1913     History   Chief Complaint Chief Complaint  Patient presents with  . Post-op Problem    HPI Hassan Rowanimothy W Tammen is a 27 y.o. male.  27 year old male presents complaining of severe pain to the back of his throat after having surgery for snoring.  States he had a little bit of bleeding this morning which has since resolved.  Feels as if he is having trouble swallowing but denies any dyspnea.  No fever or chills.  No emesis noted.  Has had decreased oral intake.  Scheduled to see his doctor tomorrow but was concerned and came today.     Past Medical History:  Diagnosis Date  . Hypertriglyceridemia 08/07/2014  . Hypothyroidism 04/19/2015    Patient Active Problem List   Diagnosis Date Noted  . Hypersomnolence 09/22/2017  . ADHD 09/22/2017  . Anxiety and depression 09/22/2017  . Lack of concentration 04/23/2017  . Low back strain 10/15/2015  . Plantar wart of both feet 04/19/2015  . Hypothyroidism 04/19/2015  . Obesity 10/31/2014  . Fatigue 10/31/2014  . Routine general medical examination at a health care facility 09/18/2014  . Snoring 08/07/2014  . Hypertriglyceridemia 08/07/2014    Past Surgical History:  Procedure Laterality Date  . ADENOIDECTOMY    . TONSILLECTOMY    . UVULECTOMY    . WISDOM TOOTH EXTRACTION          Home Medications    Prior to Admission medications   Medication Sig Start Date End Date Taking? Authorizing Provider  amoxicillin (AMOXIL) 250 MG/5ML suspension Take 10 mLs by mouth 2 (two) times daily. 11/12/17  Yes [provider]  amphetamine-dextroamphetamine (ADDERALL XR) 20 MG 24 hr capsule Take 1 capsule (20 mg total) by mouth every morning. 11/01/17 12/01/17 Yes Corwin LevinsJohn, James W, MD  amphetamine-dextroamphetamine (ADDERALL) 10 MG tablet Take 1 tablet (10 mg total) by mouth daily. About 2 PM 11/01/17  Yes Corwin LevinsJohn, James W, MD   fenofibrate 160 MG tablet Take 1 tablet (160 mg total) by mouth daily. 09/22/17  Yes Corwin LevinsJohn, James W, MD  HYDROcodone-acetaminophen (HYCET) 7.5-325 mg/15 ml solution Take 10-15 mLs by mouth every 6 (six) hours as needed for moderate pain.  11/12/17  Yes [provider]  ibuprofen (ADVIL,MOTRIN) 200 MG tablet Take 400 mg by mouth every 6 (six) hours as needed for moderate pain.   Yes [provider]  levothyroxine (SYNTHROID, LEVOTHROID) 75 MCG tablet Take 1 tablet (75 mcg total) by mouth daily before breakfast. 09/22/17  Yes Corwin LevinsJohn, James W, MD    Family History Family History  Problem Relation Age of Onset  . Drug abuse Mother        Prescription medications  . Hypertension Mother   . Lung cancer Mother     Social History Social History   Tobacco Use  . Smoking status: Never Smoker  . Smokeless tobacco: Never Used  Substance Use Topics  . Alcohol use: Not Currently  . Drug use: No     Allergies   Patient has no known allergies.   Review of Systems Review of Systems  All other systems reviewed and are negative.    Physical Exam Updated Vital Signs BP (!) 138/101 (BP Location: Left Arm)   Pulse (!) 115   Temp 98.6 F (37 C) (Oral)   Resp 18   Ht 1.778 m (5\' 10" )   Wt 99.8 kg (  220 lb)   SpO2 100%   BMI 31.57 kg/m   Physical Exam  Constitutional: He is oriented to person, place, and time. He appears well-developed and well-nourished.  Non-toxic appearance. No distress.  HENT:  Head: Normocephalic and atraumatic.  No active bleeding noted.  Cauterized areas are intact.  No stridor appreciated.  No signs of infection.  Eyes: Pupils are equal, round, and reactive to light. Conjunctivae, EOM and lids are normal.  Neck: Normal range of motion. Neck supple. No tracheal deviation present. No thyroid mass present.  Cardiovascular: Normal rate, regular rhythm and normal heart sounds. Exam reveals no gallop.  No murmur heard. Pulmonary/Chest: Effort normal and  breath sounds normal. No stridor. No respiratory distress. He has no decreased breath sounds. He has no wheezes. He has no rhonchi. He has no rales.  Abdominal: Soft. Normal appearance and bowel sounds are normal. He exhibits no distension. There is no tenderness. There is no rebound and no CVA tenderness.  Musculoskeletal: Normal range of motion. He exhibits no edema or tenderness.  Neurological: He is alert and oriented to person, place, and time. He has normal strength. No cranial nerve deficit or sensory deficit. GCS eye subscore is 4. GCS verbal subscore is 5. GCS motor subscore is 6.  Skin: Skin is warm and dry. No abrasion and no rash noted.  Psychiatric: He has a normal mood and affect. His speech is normal and behavior is normal.  Nursing note and vitals reviewed.    ED Treatments / Results  Labs (all labs ordered are listed, but only abnormal results are displayed) Labs Reviewed - No data to display  EKG None  Radiology No results found.  Procedures Procedures (including critical care time)  Medications Ordered in ED Medications  sodium chloride 0.9 % bolus 2,000 mL (has no administration in time range)  0.9 %  sodium chloride infusion (has no administration in time range)  ondansetron (ZOFRAN) injection 4 mg (has no administration in time range)  morphine 4 MG/ML injection 4 mg (has no administration in time range)  dexamethasone (DECADRON) injection 10 mg (has no administration in time range)     Initial Impression / Assessment and Plan / ED Course  I have reviewed the triage vital signs and the nursing notes.  Pertinent labs & imaging results that were available during my care of the patient were reviewed by me and considered in my medical decision making (see chart for details).     Spoke with Dr.teoh, ent doctor on call, recommends patient receive IV fluids as well as Decadron and pain medication.  Was given that here and he feels much better at this time.  He  has no signs of stridor.  He is handling his secretions.  Will follow up with his physician  Final Clinical Impressions(s) / ED Diagnoses   Final diagnoses:  None    ED Discharge Orders    None       Lorre Nick, MD 11/18/17 2245

## 2017-11-18 NOTE — Discharge Instructions (Addendum)
Follow-up with Dr. Ezzard StandingNewman this week.  Return here for any trouble breathing or swallowing.  Take your pain medication as directed

## 2017-11-27 ENCOUNTER — Other Ambulatory Visit: Payer: Self-pay | Admitting: Internal Medicine

## 2017-11-27 MED ORDER — AMPHETAMINE-DEXTROAMPHET ER 20 MG PO CP24: 20.0000 mg | ORAL_CAPSULE | ORAL | 0 refills | Status: DC

## 2017-11-27 MED ORDER — AMPHETAMINE-DEXTROAMPHETAMINE 10 MG PO TABS: 10.0000 mg | ORAL_TABLET | Freq: Every day | ORAL | 0 refills | Status: DC

## 2017-11-27 NOTE — Telephone Encounter (Signed)
Done erx 

## 2017-11-27 NOTE — Telephone Encounter (Signed)
11/01/2017 30# 

## 2017-11-30 ENCOUNTER — Ambulatory Visit: Payer: 59 | Admitting: Psychology

## 2017-11-30 DIAGNOSIS — F900 Attention-deficit hyperactivity disorder, predominantly inattentive type: Secondary | ICD-10-CM

## 2017-11-30 DIAGNOSIS — F330 Major depressive disorder, recurrent, mild: Secondary | ICD-10-CM

## 2017-11-30 DIAGNOSIS — F4010 Social phobia, unspecified: Secondary | ICD-10-CM

## 2017-12-21 ENCOUNTER — Encounter: Payer: Self-pay | Admitting: Internal Medicine

## 2017-12-21 ENCOUNTER — Other Ambulatory Visit (INDEPENDENT_AMBULATORY_CARE_PROVIDER_SITE_OTHER): Payer: 59

## 2017-12-21 ENCOUNTER — Ambulatory Visit (INDEPENDENT_AMBULATORY_CARE_PROVIDER_SITE_OTHER): Payer: 59 | Admitting: Internal Medicine

## 2017-12-21 VITALS — BP 122/80 | HR 87 | Temp 97.8°F | Ht 70.0 in | Wt 210.0 lb

## 2017-12-21 DIAGNOSIS — R739 Hyperglycemia, unspecified: Secondary | ICD-10-CM

## 2017-12-21 DIAGNOSIS — E781 Pure hyperglyceridemia: Secondary | ICD-10-CM | POA: Diagnosis not present

## 2017-12-21 DIAGNOSIS — I809 Phlebitis and thrombophlebitis of unspecified site: Secondary | ICD-10-CM | POA: Diagnosis not present

## 2017-12-21 DIAGNOSIS — F419 Anxiety disorder, unspecified: Secondary | ICD-10-CM

## 2017-12-21 DIAGNOSIS — F329 Major depressive disorder, single episode, unspecified: Secondary | ICD-10-CM | POA: Diagnosis not present

## 2017-12-21 DIAGNOSIS — Z0000 Encounter for general adult medical examination without abnormal findings: Secondary | ICD-10-CM

## 2017-12-21 DIAGNOSIS — E039 Hypothyroidism, unspecified: Secondary | ICD-10-CM

## 2017-12-21 DIAGNOSIS — F909 Attention-deficit hyperactivity disorder, unspecified type: Secondary | ICD-10-CM

## 2017-12-21 LAB — HEPATIC FUNCTION PANEL
ALT: 21 U/L (ref 0–53)
AST: 15 U/L (ref 0–37)
Albumin: 4.7 g/dL (ref 3.5–5.2)
Alkaline Phosphatase: 63 U/L (ref 39–117)
BILIRUBIN DIRECT: 0.1 mg/dL (ref 0.0–0.3)
BILIRUBIN TOTAL: 0.5 mg/dL (ref 0.2–1.2)
Total Protein: 7.5 g/dL (ref 6.0–8.3)

## 2017-12-21 LAB — CBC WITH DIFFERENTIAL/PLATELET
Basophils Absolute: 0 10*3/uL (ref 0.0–0.1)
Basophils Relative: 0.7 % (ref 0.0–3.0)
EOS ABS: 0.2 10*3/uL (ref 0.0–0.7)
Eosinophils Relative: 4 % (ref 0.0–5.0)
HCT: 43.2 % (ref 39.0–52.0)
Hemoglobin: 15.2 g/dL (ref 13.0–17.0)
LYMPHS ABS: 1.8 10*3/uL (ref 0.7–4.0)
Lymphocytes Relative: 32.6 % (ref 12.0–46.0)
MCHC: 35.3 g/dL (ref 30.0–36.0)
MCV: 90.2 fl (ref 78.0–100.0)
MONO ABS: 0.4 10*3/uL (ref 0.1–1.0)
Monocytes Relative: 8 % (ref 3.0–12.0)
NEUTROS ABS: 3 10*3/uL (ref 1.4–7.7)
NEUTROS PCT: 54.7 % (ref 43.0–77.0)
PLATELETS: 300 10*3/uL (ref 150.0–400.0)
RBC: 4.79 Mil/uL (ref 4.22–5.81)
RDW: 12.5 % (ref 11.5–15.5)
WBC: 5.5 10*3/uL (ref 4.0–10.5)

## 2017-12-21 LAB — BASIC METABOLIC PANEL
BUN: 12 mg/dL (ref 6–23)
CHLORIDE: 106 meq/L (ref 96–112)
CO2: 25 mEq/L (ref 19–32)
Calcium: 9.8 mg/dL (ref 8.4–10.5)
Creatinine, Ser: 0.97 mg/dL (ref 0.40–1.50)
GFR: 98.13 mL/min (ref >60.00)
GLUCOSE: 94 mg/dL (ref 70–99)
POTASSIUM: 3.8 meq/L (ref 3.5–5.1)
Sodium: 142 mEq/L (ref 135–145)

## 2017-12-21 LAB — URINALYSIS, ROUTINE W REFLEX MICROSCOPIC
BILIRUBIN URINE: NEGATIVE
Hgb urine dipstick: NEGATIVE
KETONES UR: NEGATIVE
LEUKOCYTES UA: NEGATIVE
Nitrite: NEGATIVE
PH: 5.5 (ref 5.0–8.0)
RBC / HPF: NONE SEEN (ref 0–?)
Specific Gravity, Urine: >=1.03 — AB (ref 1.000–1.030)
TOTAL PROTEIN, URINE-UPE24: NEGATIVE
URINE GLUCOSE: NEGATIVE
UROBILINOGEN UA: 0.2 (ref 0.0–1.0)

## 2017-12-21 LAB — LIPID PANEL
CHOLESTEROL: 154 mg/dL (ref 0–200)
HDL: 26.2 mg/dL — AB (ref >39.00)
LDL CALC: 105 mg/dL — AB (ref 0–99)
NONHDL: 127.51
Total CHOL/HDL Ratio: 6
Triglycerides: 112 mg/dL (ref 0.0–149.0)
VLDL: 22.4 mg/dL (ref 0.0–40.0)

## 2017-12-21 LAB — TSH: TSH: 1.45 u[IU]/mL (ref 0.35–4.50)

## 2017-12-21 LAB — HEMOGLOBIN A1C: HEMOGLOBIN A1C: 5.2 % (ref 4.6–6.5)

## 2017-12-21 NOTE — Patient Instructions (Signed)
You have evidence for healing superficial phlebitis today; no specific treatment is needed for now  Please continue all other medications as before, and refills have been done if requested.  Please have the pharmacy call with any other refills you may need.  Please continue your efforts at being more active, low cholesterol diet, and weight control.  You are otherwise up to date with prevention measures today.  Please keep your appointments with your specialists as you may have planned  Please go to the LAB in the Basement (turn left off the elevator) for the tests to be done today  You will be contacted by phone if any changes need to be made immediately.  Otherwise, you will receive a letter about your results with an explanation, but please check with MyChart first.  Please remember to sign up for MyChart if you have not done so, as this will be important to you in the future with finding out test results, communicating by private email, and scheduling acute appointments online when needed.  Please return in 1 year for your yearly visit, or sooner if needed, with Lab testing done 3-5 days before

## 2017-12-21 NOTE — Assessment & Plan Note (Signed)
Improved,  to f/u any worsening symptoms or concerns, cont same tx

## 2017-12-21 NOTE — Assessment & Plan Note (Signed)
For lower fat diet, f/u lab todya

## 2017-12-21 NOTE — Assessment & Plan Note (Signed)
Minor but slightl elevated last visit, for f/u lab today

## 2017-12-21 NOTE — Assessment & Plan Note (Signed)
stable overall by history and exam, recent data reviewed with pt, and pt to continue medical treatment as before,  to f/u any worsening symptoms or concerns Lab Results  Component Value Date   TSH 2.36 04/23/2017

## 2017-12-21 NOTE — Assessment & Plan Note (Signed)
Very mild, reassured, no specific tx needed,  to f/u any worsening symptoms or concerns

## 2017-12-21 NOTE — Progress Notes (Signed)
Subjective:    Patient ID: Gerald Watts, male    DOB: 08/21/1990, 27 y.o.   MRN: [id number]  HPI  Here for wellness and f/u;  Overall doing ok;  Pt denies Chest pain, worsening SOB, DOE, wheezing, orthopnea, PND, worsening LE edema, palpitations, dizziness or syncope.  Pt denies neurological change such as new headache, facial or extremity weakness.  Pt denies polydipsia, polyuria, or low sugar symptoms. Pt states overall good compliance with treatment and medications, good tolerability, and has been trying to follow appropriate diet.  Pt denies worsening depressive symptoms, suicidal ideation or panic. No fever, night sweats, wt loss, loss of appetite, or other constitutional symptoms.  Pt states good ability with ADL's, has low fall risk, home safety reviewed and adequate, no other significant changes in hearing or vision, and only occasionally active with exercise, but has been able to lose 35 lbs with adderall and more activity   Got raise at work with better work performance on adderall.   Denies worsening depressive symptoms, suicidal ideation, or panic; has ongoing anxiety, but not worsening and declines further need for tx.   Tolerating fenofibrate ok. Did have surgury late mar 2019 with IV to distal anterior arm, today with 2 small lumps in a row proximal to the IV site that are not healing very fast Past Medical History:  Diagnosis Date  . Hypertriglyceridemia 08/07/2014  . Hypothyroidism 04/19/2015   Past Surgical History:  Procedure Laterality Date  . ADENOIDECTOMY    . TONSILLECTOMY    . UVULECTOMY    . WISDOM TOOTH EXTRACTION      reports that he has never smoked. He has never used smokeless tobacco. He reports that he drank alcohol. He reports that he does not use drugs. family history includes Drug abuse in his mother; Hypertension in his mother; Lung cancer in his mother. No Known Allergies Current Outpatient Medications on File Prior to Visit  Medication Sig Dispense Refill    . amphetamine-dextroamphetamine (ADDERALL XR) 20 MG 24 hr capsule Take 1 capsule (20 mg total) by mouth every morning. 30 capsule 0  . amphetamine-dextroamphetamine (ADDERALL) 10 MG tablet Take 1 tablet (10 mg total) by mouth daily. About 2 PM 30 tablet 0  . fenofibrate 160 MG tablet Take 1 tablet (160 mg total) by mouth daily. 90 tablet 3  . levothyroxine (SYNTHROID, LEVOTHROID) 75 MCG tablet Take 1 tablet (75 mcg total) by mouth daily before breakfast. 90 tablet 3   No current facility-administered medications on file prior to visit.    Review of Systems Constitutional: Negative for other unusual diaphoresis, sweats, appetite or weight changes HENT: Negative for other worsening hearing loss, ear pain, facial swelling, mouth sores or neck stiffness.   Eyes: Negative for other worsening pain, redness or other visual disturbance.  Respiratory: Negative for other stridor or swelling Cardiovascular: Negative for other palpitations or other chest pain  Gastrointestinal: Negative for worsening diarrhea or loose stools, blood in stool, distention or other pain Genitourinary: Negative for hematuria, flank pain or other change in urine volume.  Musculoskeletal: Negative for myalgias or other joint swelling.  Skin: Negative for other color change, or other wound or worsening drainage.  Neurological: Negative for other syncope or numbness. Hematological: Negative for other adenopathy or swelling Psychiatric/Behavioral: Negative for hallucinations, other worsening agitation, SI, self-injury, or new decreased concentration ALl other system neg per pt    Objective:   Physical Exam BP 122/80   Pulse 87   Temp 97.8 F (  36.6 C) (Oral)   Ht  (1.778 m)   Wt 210 lb (95.3 kg)   SpO2 96%   BMI 30.13 kg/m . VS noted,  Constitutional: Pt is oriented to person, place, and time. Appears well-developed and well-nourished, in no significant distress and comfortable Head: Normocephalic and atraumatic   Eyes: Conjunctivae and EOM are normal. Pupils are equal, round, and reactive to light Right Ear: External ear normal without discharge Left Ear: External ear normal without discharge Nose: Nose without discharge or deformity Mouth/Throat: Oropharynx is without other ulcerations and moist  Neck: Normal range of motion. Neck supple. No JVD present. No tracheal deviation present or significant neck LA or mass Cardiovascular: Normal rate, regular rhythm, normal heart sounds and intact distal pulses.   Pulmonary/Chest: WOB normal and breath sounds without rales or wheezing  Abdominal: Soft. Bowel sounds are normal. NT. No HSM  Musculoskeletal: Normal range of motion. Exhibits no edema Lymphadenopathy: Has no other cervical adenopathy.  Neurological: Pt is alert and oriented to person, place, and time. Pt has normal reflexes. No cranial nerve deficit. Motor grossly intact, Gait intact Skin: Skin is warm and dry. No rash noted or new ulcerations, right distal arm anterior with 2 small areas venous firm nontender clotting just proximal to the IV site  Psychiatric:  Has mild nervous mood and affect. Behavior is normal without agitation No other exam findings  Lab Results  Component Value Date   WBC 5.5 04/23/2017   HGB 15.6 04/23/2017   HCT 44.0 04/23/2017   PLT 276.0 04/23/2017   GLUCOSE 101 (H) 04/23/2017   CHOL 162 04/23/2017   TRIG (H) 04/23/2017    429.0 Triglyceride is over 400; calculations on Lipids are invalid.   HDL 20.80 (L) 04/23/2017   LDLDIRECT 60.0 04/23/2017   LDLCALC 124 (H) 01/15/2016   ALT 43 04/23/2017   AST 21 04/23/2017   NA 141 04/23/2017   K 3.7 04/23/2017   CL 105 04/23/2017   CREATININE 0.87 04/23/2017   BUN 12 04/23/2017   CO2 25 04/23/2017   TSH 2.36 04/23/2017        Assessment & Plan:

## 2017-12-21 NOTE — Assessment & Plan Note (Signed)
stable overall by history and exam, recent data reviewed with pt, and pt to continue medical treatment as before,  to f/u any worsening symptoms or concerns  

## 2017-12-25 ENCOUNTER — Other Ambulatory Visit: Payer: Self-pay | Admitting: Internal Medicine

## 2017-12-28 ENCOUNTER — Other Ambulatory Visit: Payer: Self-pay | Admitting: Internal Medicine

## 2017-12-28 MED ORDER — AMPHETAMINE-DEXTROAMPHET ER 20 MG PO CP24: 20.0000 mg | ORAL_CAPSULE | ORAL | 0 refills | Status: DC

## 2017-12-28 MED ORDER — AMPHETAMINE-DEXTROAMPHETAMINE 10 MG PO TABS: 10.0000 mg | ORAL_TABLET | Freq: Every day | ORAL | 0 refills | Status: DC

## 2017-12-28 NOTE — Telephone Encounter (Signed)
Done erx 

## 2017-12-28 NOTE — Telephone Encounter (Signed)
Both filled on: 11/30/2017 30#

## 2018-01-07 ENCOUNTER — Ambulatory Visit: Payer: 59 | Admitting: Psychology

## 2018-01-19 ENCOUNTER — Ambulatory Visit: Payer: 59 | Admitting: Psychology

## 2018-01-26 ENCOUNTER — Other Ambulatory Visit: Payer: Self-pay | Admitting: Internal Medicine

## 2018-01-26 MED ORDER — AMPHETAMINE-DEXTROAMPHETAMINE 10 MG PO TABS: 10.0000 mg | ORAL_TABLET | Freq: Every day | ORAL | 0 refills | Status: DC

## 2018-01-26 MED ORDER — AMPHETAMINE-DEXTROAMPHET ER 20 MG PO CP24: 20.0000 mg | ORAL_CAPSULE | ORAL | 0 refills | Status: DC

## 2018-01-26 NOTE — Telephone Encounter (Signed)
Both scripts filled on 12/28/2017 30#

## 2018-01-26 NOTE — Telephone Encounter (Signed)
Done erx 

## 2018-02-23 ENCOUNTER — Other Ambulatory Visit: Payer: Self-pay | Admitting: Internal Medicine

## 2018-02-23 MED ORDER — AMPHETAMINE-DEXTROAMPHETAMINE 10 MG PO TABS: 10.0000 mg | ORAL_TABLET | Freq: Every day | ORAL | 0 refills | Status: DC

## 2018-02-23 MED ORDER — AMPHETAMINE-DEXTROAMPHET ER 20 MG PO CP24: 20.0000 mg | ORAL_CAPSULE | ORAL | 0 refills | Status: DC

## 2018-02-23 NOTE — Telephone Encounter (Signed)
Done erx 

## 2018-02-26 ENCOUNTER — Other Ambulatory Visit: Payer: Self-pay | Admitting: Internal Medicine

## 2018-02-26 ENCOUNTER — Encounter: Payer: Self-pay | Admitting: Internal Medicine

## 2018-02-26 NOTE — Telephone Encounter (Signed)
[place] Controlled Substance Database checked. Last filled on 01/29/18 

## 2018-02-26 NOTE — Telephone Encounter (Signed)
Please advise on patient's MyChart response.

## 2018-02-26 NOTE — Telephone Encounter (Signed)
Done erx 

## 2018-02-26 NOTE — Telephone Encounter (Signed)
Correction  meds  Not done erx  As have already been refilled with a future fill date of February 28, 2018 for both  I cannot authorize early refills

## 2018-03-01 NOTE — Telephone Encounter (Signed)
See below

## 2018-03-01 NOTE — Telephone Encounter (Signed)
Please clarify which pharmacy he is referring to, and let him know rx s were already sent with a fill date of July 14

## 2018-03-02 NOTE — Telephone Encounter (Signed)
Called pt and verified pharmacy, [facility] [place]. Pt stated that pharmacy had to record of these 2 scripts.   Pharmacy opens at 9am, I will call and verify the above statement during opening hours.

## 2018-03-02 NOTE — Telephone Encounter (Signed)
Spoke with pharmacist, they did have the scripts on file and will fill them today.

## 2018-03-04 ENCOUNTER — Ambulatory Visit: Payer: 59 | Admitting: Psychology

## 2018-03-04 DIAGNOSIS — F4010 Social phobia, unspecified: Secondary | ICD-10-CM | POA: Diagnosis not present

## 2018-03-04 DIAGNOSIS — F330 Major depressive disorder, recurrent, mild: Secondary | ICD-10-CM

## 2018-03-04 DIAGNOSIS — F900 Attention-deficit hyperactivity disorder, predominantly inattentive type: Secondary | ICD-10-CM | POA: Diagnosis not present

## 2018-03-30 ENCOUNTER — Other Ambulatory Visit: Payer: Self-pay | Admitting: Internal Medicine

## 2018-03-30 MED ORDER — AMPHETAMINE-DEXTROAMPHETAMINE 10 MG PO TABS: 10.0000 mg | ORAL_TABLET | Freq: Every day | ORAL | 0 refills | Status: DC

## 2018-03-30 MED ORDER — AMPHETAMINE-DEXTROAMPHET ER 20 MG PO CP24: 20.0000 mg | ORAL_CAPSULE | ORAL | 0 refills | Status: DC

## 2018-03-30 NOTE — Telephone Encounter (Signed)
Done erx 

## 2018-04-01 ENCOUNTER — Ambulatory Visit: Payer: 59 | Admitting: Psychology

## 2018-04-01 DIAGNOSIS — F4010 Social phobia, unspecified: Secondary | ICD-10-CM | POA: Diagnosis not present

## 2018-04-01 DIAGNOSIS — F330 Major depressive disorder, recurrent, mild: Secondary | ICD-10-CM | POA: Diagnosis not present

## 2018-04-01 DIAGNOSIS — F900 Attention-deficit hyperactivity disorder, predominantly inattentive type: Secondary | ICD-10-CM | POA: Diagnosis not present

## 2018-04-22 ENCOUNTER — Ambulatory Visit: Payer: 59 | Admitting: Psychology

## 2018-04-29 ENCOUNTER — Other Ambulatory Visit: Payer: Self-pay | Admitting: Internal Medicine

## 2018-04-29 MED ORDER — AMPHETAMINE-DEXTROAMPHET ER 20 MG PO CP24: 20.0000 mg | ORAL_CAPSULE | ORAL | 0 refills | Status: DC

## 2018-04-29 MED ORDER — AMPHETAMINE-DEXTROAMPHETAMINE 10 MG PO TABS: 10.0000 mg | ORAL_TABLET | Freq: Every day | ORAL | 0 refills | Status: DC

## 2018-04-29 NOTE — Telephone Encounter (Signed)
Done erx 

## 2018-04-30 ENCOUNTER — Ambulatory Visit: Payer: 59 | Admitting: Psychology

## 2018-04-30 DIAGNOSIS — F4010 Social phobia, unspecified: Secondary | ICD-10-CM | POA: Diagnosis not present

## 2018-04-30 DIAGNOSIS — F330 Major depressive disorder, recurrent, mild: Secondary | ICD-10-CM

## 2018-04-30 DIAGNOSIS — F900 Attention-deficit hyperactivity disorder, predominantly inattentive type: Secondary | ICD-10-CM

## 2018-05-21 ENCOUNTER — Ambulatory Visit: Payer: 59 | Admitting: Psychology

## 2018-05-29 ENCOUNTER — Other Ambulatory Visit: Payer: Self-pay | Admitting: Internal Medicine

## 2018-05-31 MED ORDER — AMPHETAMINE-DEXTROAMPHETAMINE 10 MG PO TABS: 10.0000 mg | ORAL_TABLET | Freq: Every day | ORAL | 0 refills | Status: DC

## 2018-05-31 MED ORDER — AMPHETAMINE-DEXTROAMPHET ER 20 MG PO CP24: 20.0000 mg | ORAL_CAPSULE | ORAL | 0 refills | Status: DC

## 2018-05-31 NOTE — Telephone Encounter (Signed)
Done erx 

## 2018-06-10 ENCOUNTER — Ambulatory Visit: Payer: 59 | Admitting: Psychology

## 2018-06-14 ENCOUNTER — Ambulatory Visit (INDEPENDENT_AMBULATORY_CARE_PROVIDER_SITE_OTHER): Payer: 59 | Admitting: Psychology

## 2018-06-14 DIAGNOSIS — F330 Major depressive disorder, recurrent, mild: Secondary | ICD-10-CM

## 2018-06-14 DIAGNOSIS — F900 Attention-deficit hyperactivity disorder, predominantly inattentive type: Secondary | ICD-10-CM

## 2018-06-14 DIAGNOSIS — F4010 Social phobia, unspecified: Secondary | ICD-10-CM

## 2018-06-25 ENCOUNTER — Ambulatory Visit (INDEPENDENT_AMBULATORY_CARE_PROVIDER_SITE_OTHER): Payer: 59 | Admitting: Psychology

## 2018-06-25 DIAGNOSIS — F330 Major depressive disorder, recurrent, mild: Secondary | ICD-10-CM | POA: Diagnosis not present

## 2018-06-25 DIAGNOSIS — F4010 Social phobia, unspecified: Secondary | ICD-10-CM | POA: Diagnosis not present

## 2018-06-25 DIAGNOSIS — F900 Attention-deficit hyperactivity disorder, predominantly inattentive type: Secondary | ICD-10-CM | POA: Diagnosis not present

## 2018-07-01 ENCOUNTER — Other Ambulatory Visit: Payer: Self-pay | Admitting: Internal Medicine

## 2018-07-01 MED ORDER — AMPHETAMINE-DEXTROAMPHETAMINE 10 MG PO TABS: 10.0000 mg | ORAL_TABLET | Freq: Every day | ORAL | 0 refills | Status: DC

## 2018-07-01 MED ORDER — AMPHETAMINE-DEXTROAMPHET ER 20 MG PO CP24: 20.0000 mg | ORAL_CAPSULE | ORAL | 0 refills | Status: DC

## 2018-07-01 NOTE — Telephone Encounter (Signed)
Done erx 

## 2018-07-02 NOTE — Telephone Encounter (Signed)
MD approved and sent electronically to [facility]  

## 2018-07-27 ENCOUNTER — Other Ambulatory Visit: Payer: Self-pay | Admitting: Internal Medicine

## 2018-07-27 ENCOUNTER — Encounter: Payer: Self-pay | Admitting: Internal Medicine

## 2018-07-27 DIAGNOSIS — E039 Hypothyroidism, unspecified: Secondary | ICD-10-CM

## 2018-07-27 MED ORDER — LEVOTHYROXINE SODIUM 75 MCG PO TABS: 75.0000 ug | ORAL_TABLET | Freq: Every day | ORAL | 1 refills | Status: DC

## 2018-07-27 MED ORDER — AMPHETAMINE-DEXTROAMPHETAMINE 10 MG PO TABS: 10.0000 mg | ORAL_TABLET | Freq: Every day | ORAL | 0 refills | Status: DC

## 2018-07-27 MED ORDER — FENOFIBRATE 160 MG PO TABS: 160.0000 mg | ORAL_TABLET | Freq: Every day | ORAL | 1 refills | Status: DC

## 2018-07-27 MED ORDER — AMPHETAMINE-DEXTROAMPHET ER 20 MG PO CP24: 20.0000 mg | ORAL_CAPSULE | ORAL | 0 refills | Status: DC

## 2018-07-27 NOTE — Telephone Encounter (Signed)
Done erx 

## 2018-08-26 ENCOUNTER — Encounter: Payer: Self-pay | Admitting: Internal Medicine

## 2018-08-31 ENCOUNTER — Other Ambulatory Visit: Payer: Self-pay | Admitting: Internal Medicine

## 2018-08-31 MED ORDER — AMPHETAMINE-DEXTROAMPHETAMINE 10 MG PO TABS: 10.0000 mg | ORAL_TABLET | Freq: Every day | ORAL | 0 refills | Status: DC

## 2018-08-31 MED ORDER — AMPHETAMINE-DEXTROAMPHET ER 20 MG PO CP24: 20.0000 mg | ORAL_CAPSULE | ORAL | 0 refills | Status: DC

## 2018-08-31 NOTE — Telephone Encounter (Signed)
Done erx 

## 2018-10-02 ENCOUNTER — Other Ambulatory Visit: Payer: Self-pay | Admitting: Internal Medicine

## 2018-10-04 MED ORDER — AMPHETAMINE-DEXTROAMPHETAMINE 10 MG PO TABS: 10.0000 mg | ORAL_TABLET | Freq: Every day | ORAL | 0 refills | Status: DC

## 2018-10-04 MED ORDER — AMPHETAMINE-DEXTROAMPHET ER 20 MG PO CP24: 20.0000 mg | ORAL_CAPSULE | ORAL | 0 refills | Status: DC

## 2018-10-04 NOTE — Telephone Encounter (Signed)
Done erx 

## 2018-11-01 ENCOUNTER — Other Ambulatory Visit: Payer: Self-pay | Admitting: Internal Medicine

## 2018-11-01 MED ORDER — AMPHETAMINE-DEXTROAMPHETAMINE 10 MG PO TABS: 10.0000 mg | ORAL_TABLET | Freq: Every day | ORAL | 0 refills | Status: DC

## 2018-11-01 MED ORDER — AMPHETAMINE-DEXTROAMPHET ER 20 MG PO CP24: 20.0000 mg | ORAL_CAPSULE | ORAL | 0 refills | Status: DC

## 2018-11-01 NOTE — Telephone Encounter (Signed)
Done erx 

## 2018-11-30 ENCOUNTER — Other Ambulatory Visit: Payer: Self-pay | Admitting: Internal Medicine

## 2018-11-30 MED ORDER — AMPHETAMINE-DEXTROAMPHETAMINE 10 MG PO TABS: 10.0000 mg | ORAL_TABLET | Freq: Every day | ORAL | 0 refills | Status: DC

## 2018-11-30 MED ORDER — AMPHETAMINE-DEXTROAMPHET ER 20 MG PO CP24: 20.0000 mg | ORAL_CAPSULE | ORAL | 0 refills | Status: DC

## 2018-11-30 NOTE — Telephone Encounter (Signed)
Done erx 

## 2019-01-06 ENCOUNTER — Other Ambulatory Visit: Payer: Self-pay | Admitting: Internal Medicine

## 2019-01-06 MED ORDER — AMPHETAMINE-DEXTROAMPHET ER 20 MG PO CP24: 20.0000 mg | ORAL_CAPSULE | ORAL | 0 refills | Status: DC

## 2019-01-06 MED ORDER — AMPHETAMINE-DEXTROAMPHETAMINE 10 MG PO TABS: 10.0000 mg | ORAL_TABLET | Freq: Every day | ORAL | 0 refills | Status: DC

## 2019-01-06 NOTE — Telephone Encounter (Signed)
Done erx  Please to ask pt to make ROV/yearly visit for further refills

## 2019-02-09 ENCOUNTER — Other Ambulatory Visit: Payer: Self-pay | Admitting: Internal Medicine

## 2019-02-10 MED ORDER — AMPHETAMINE-DEXTROAMPHET ER 20 MG PO CP24: 20.0000 mg | ORAL_CAPSULE | ORAL | 0 refills | Status: DC

## 2019-02-10 MED ORDER — AMPHETAMINE-DEXTROAMPHETAMINE 10 MG PO TABS: 10.0000 mg | ORAL_TABLET | Freq: Every day | ORAL | 0 refills | Status: DC

## 2019-02-10 NOTE — Telephone Encounter (Signed)
Please let pt know  rx x 2 are done, but really needs to make ROV of some kind (virtual ok) for further refills

## 2019-02-28 ENCOUNTER — Ambulatory Visit (INDEPENDENT_AMBULATORY_CARE_PROVIDER_SITE_OTHER): Payer: Self-pay | Admitting: Internal Medicine

## 2019-02-28 ENCOUNTER — Encounter: Payer: Self-pay | Admitting: Internal Medicine

## 2019-02-28 DIAGNOSIS — F909 Attention-deficit hyperactivity disorder, unspecified type: Secondary | ICD-10-CM

## 2019-02-28 DIAGNOSIS — R739 Hyperglycemia, unspecified: Secondary | ICD-10-CM

## 2019-02-28 DIAGNOSIS — E611 Iron deficiency: Secondary | ICD-10-CM

## 2019-02-28 DIAGNOSIS — E538 Deficiency of other specified B group vitamins: Secondary | ICD-10-CM

## 2019-02-28 DIAGNOSIS — Z0000 Encounter for general adult medical examination without abnormal findings: Secondary | ICD-10-CM

## 2019-02-28 DIAGNOSIS — E039 Hypothyroidism, unspecified: Secondary | ICD-10-CM

## 2019-02-28 DIAGNOSIS — E559 Vitamin D deficiency, unspecified: Secondary | ICD-10-CM

## 2019-02-28 MED ORDER — AMPHETAMINE-DEXTROAMPHETAMINE 10 MG PO TABS: 10.0000 mg | ORAL_TABLET | Freq: Every day | ORAL | 0 refills | Status: DC

## 2019-02-28 MED ORDER — AMPHETAMINE-DEXTROAMPHET ER 20 MG PO CP24: 20.0000 mg | ORAL_CAPSULE | ORAL | 0 refills | Status: DC

## 2019-02-28 MED ORDER — LEVOTHYROXINE SODIUM 75 MCG PO TABS: 75.0000 ug | ORAL_TABLET | Freq: Every day | ORAL | 3 refills | Status: AC

## 2019-02-28 NOTE — Assessment & Plan Note (Signed)
stable overall by history and exam, recent data reviewed with pt, and pt to continue medical treatment as before,  to f/u any worsening symptoms or concerns  

## 2019-02-28 NOTE — Progress Notes (Signed)
Patient ID: Gerald Watts, male   DOB: [birth year], 28 y.o.   MRN: [id number]  Virtual Visit via Video Note  I connected with Gerald Watts on 02/28/19 at  4:00 PM EDT by a video enabled telemedicine application and verified that I am speaking with the correct person using two identifiers.  Location: Patient: at home Provider: at office   I discussed the limitations of evaluation and management by telemedicine and the availability of in person appointments. The patient expressed understanding and agreed to proceed.  History of Present Illness: Here for wellness and f/u;  Overall doing ok;  Pt denies Chest pain, worsening SOB, DOE, wheezing, orthopnea, PND, worsening LE edema, palpitations, dizziness or syncope.  Pt denies neurological change such as new headache, facial or extremity weakness.  Pt denies polydipsia, polyuria, or low sugar symptoms. Pt states overall good compliance with treatment and medications, good tolerability, and has been trying to follow appropriate diet.  Pt denies worsening depressive symptoms, suicidal ideation or panic. No fever, night sweats, wt loss, loss of appetite, or other constitutional symptoms.  Pt states good ability with ADL's, has low fall risk, home safety reviewed and adequate, no other significant changes in hearing or vision, and only occasionally active with exercise.  No new complaints.  ADD med working well with task completion at work. Past Medical History:  Diagnosis Date  . Hypertriglyceridemia 08/07/2014  . Hypothyroidism 04/19/2015   Past Surgical History:  Procedure Laterality Date  . ADENOIDECTOMY    . TONSILLECTOMY    . UVULECTOMY    . WISDOM TOOTH EXTRACTION      reports that he has never smoked. He has never used smokeless tobacco. He reports previous alcohol use. He reports that he does not use drugs. family history includes Drug abuse in his mother; Hypertension in his mother; Lung cancer in his mother. No Known Allergies No  current outpatient medications on file prior to visit.   No current facility-administered medications on file prior to visit.     Observations/Objective: Alert, NAD, appropriate mood and affect, resps normal, cn 2-12 intact, moves all 4s, no visible rash or swelling Lab Results  Component Value Date   WBC 5.5 12/21/2017   HGB 15.2 12/21/2017   HCT 43.2 12/21/2017   PLT 300.0 12/21/2017   GLUCOSE 94 12/21/2017   CHOL 154 12/21/2017   TRIG 112.0 12/21/2017   HDL 26.20 (L) 12/21/2017   LDLDIRECT 60.0 04/23/2017   LDLCALC 105 (H) 12/21/2017   ALT 21 12/21/2017   AST 15 12/21/2017   NA 142 12/21/2017   K 3.8 12/21/2017   CL 106 12/21/2017   CREATININE 0.97 12/21/2017   BUN 12 12/21/2017   CO2 25 12/21/2017   TSH 1.45 12/21/2017   HGBA1C 5.2 12/21/2017   Assessment and Plan: See notes  Follow Up Instructions: See notes   I discussed the assessment and treatment plan with the patient. The patient was provided an opportunity to ask questions and all were answered. The patient agreed with the plan and demonstrated an understanding of the instructions.   The patient was advised to call back or seek an in-person evaluation if the symptoms worsen or if the condition fails to improve as anticipated.   Gerald [name], MD

## 2019-02-28 NOTE — Patient Instructions (Signed)

## 2019-02-28 NOTE — Assessment & Plan Note (Signed)

## 2019-04-04 ENCOUNTER — Other Ambulatory Visit: Payer: Self-pay | Admitting: Internal Medicine

## 2019-04-04 ENCOUNTER — Encounter: Payer: Self-pay | Admitting: Internal Medicine

## 2019-04-04 MED ORDER — AMPHETAMINE-DEXTROAMPHETAMINE 10 MG PO TABS: 10.0000 mg | ORAL_TABLET | Freq: Every day | ORAL | 0 refills | Status: DC

## 2019-04-04 MED ORDER — AMPHETAMINE-DEXTROAMPHET ER 20 MG PO CP24: 20.0000 mg | ORAL_CAPSULE | ORAL | 0 refills | Status: DC

## 2019-04-04 NOTE — Telephone Encounter (Signed)
Done erx 

## 2019-05-09 ENCOUNTER — Other Ambulatory Visit: Payer: Self-pay | Admitting: Internal Medicine

## 2019-05-09 MED ORDER — AMPHETAMINE-DEXTROAMPHETAMINE 10 MG PO TABS: 10.0000 mg | ORAL_TABLET | Freq: Every day | ORAL | 0 refills | Status: DC

## 2019-05-09 MED ORDER — AMPHETAMINE-DEXTROAMPHET ER 20 MG PO CP24: 20.0000 mg | ORAL_CAPSULE | ORAL | 0 refills | Status: DC

## 2019-05-09 NOTE — Telephone Encounter (Signed)
Done erx 

## 2019-06-14 ENCOUNTER — Other Ambulatory Visit: Payer: Self-pay | Admitting: Internal Medicine

## 2019-06-14 MED ORDER — AMPHETAMINE-DEXTROAMPHETAMINE 10 MG PO TABS: 10.0000 mg | ORAL_TABLET | Freq: Every day | ORAL | 0 refills | Status: DC

## 2019-06-14 MED ORDER — AMPHETAMINE-DEXTROAMPHET ER 20 MG PO CP24: 20.0000 mg | ORAL_CAPSULE | ORAL | 0 refills | Status: DC

## 2019-06-14 NOTE — Telephone Encounter (Signed)
Done erx 

## 2019-07-06 ENCOUNTER — Encounter: Payer: Self-pay | Admitting: Internal Medicine

## 2019-07-06 MED ORDER — IVERMECTIN 3 MG PO TABS: 18.0000 mg | ORAL_TABLET | Freq: Once | ORAL | 0 refills | Status: AC

## 2019-07-06 NOTE — Telephone Encounter (Signed)
Ok , ivermectin is done erx

## 2019-07-27 ENCOUNTER — Other Ambulatory Visit: Payer: Self-pay | Admitting: Internal Medicine

## 2019-07-28 MED ORDER — AMPHETAMINE-DEXTROAMPHET ER 20 MG PO CP24: 20.0000 mg | ORAL_CAPSULE | ORAL | 0 refills | Status: DC

## 2019-07-28 MED ORDER — AMPHETAMINE-DEXTROAMPHETAMINE 10 MG PO TABS: 10.0000 mg | ORAL_TABLET | Freq: Every day | ORAL | 0 refills | Status: DC

## 2019-07-28 NOTE — Telephone Encounter (Signed)
Done erx 

## 2019-09-02 ENCOUNTER — Other Ambulatory Visit: Payer: Self-pay | Admitting: Internal Medicine

## 2019-09-06 ENCOUNTER — Other Ambulatory Visit: Payer: Self-pay | Admitting: Internal Medicine

## 2019-09-08 ENCOUNTER — Encounter: Payer: Self-pay | Admitting: Internal Medicine

## 2019-09-08 MED ORDER — AMPHETAMINE-DEXTROAMPHETAMINE 10 MG PO TABS: 10.0000 mg | ORAL_TABLET | Freq: Every day | ORAL | 0 refills | Status: DC

## 2019-09-08 MED ORDER — AMPHETAMINE-DEXTROAMPHET ER 20 MG PO CP24: 20.0000 mg | ORAL_CAPSULE | ORAL | 0 refills | Status: DC

## 2019-09-08 NOTE — Telephone Encounter (Signed)
Both done erx 

## 2019-09-12 NOTE — Telephone Encounter (Addendum)
Key for the capsule extended release: [id number]   Key for the tablet to be taken in the afternoon, prn: [id number]

## 2019-10-10 ENCOUNTER — Other Ambulatory Visit: Payer: Self-pay | Admitting: Internal Medicine

## 2019-10-10 MED ORDER — AMPHETAMINE-DEXTROAMPHETAMINE 10 MG PO TABS: 10.0000 mg | ORAL_TABLET | Freq: Every day | ORAL | 0 refills | Status: DC

## 2019-10-10 MED ORDER — AMPHETAMINE-DEXTROAMPHET ER 20 MG PO CP24: 20.0000 mg | ORAL_CAPSULE | ORAL | 0 refills | Status: DC

## 2019-10-10 NOTE — Telephone Encounter (Signed)
Done erx 

## 2019-11-11 ENCOUNTER — Other Ambulatory Visit: Payer: Self-pay | Admitting: Internal Medicine

## 2019-11-11 MED ORDER — AMPHETAMINE-DEXTROAMPHETAMINE 10 MG PO TABS: 10.0000 mg | ORAL_TABLET | Freq: Every day | ORAL | 0 refills | Status: DC

## 2019-11-11 MED ORDER — AMPHETAMINE-DEXTROAMPHET ER 20 MG PO CP24: 20.0000 mg | ORAL_CAPSULE | ORAL | 0 refills | Status: DC

## 2019-11-11 NOTE — Telephone Encounter (Signed)
Donee erx 

## 2019-11-14 ENCOUNTER — Telehealth: Payer: Self-pay

## 2019-11-14 NOTE — Telephone Encounter (Signed)
Not sure what to do with this message, since this med was refilled mar 26

## 2019-11-15 NOTE — Telephone Encounter (Signed)
Error - closing note

## 2019-11-21 ENCOUNTER — Encounter: Payer: Self-pay | Admitting: Internal Medicine

## 2019-11-21 NOTE — Telephone Encounter (Signed)
Received a fax from pharmacy regarding adderall refill for 10 and 20mg  tablets. It stated Insurance preferred a 90 day supply.  Please advise if you are ok with sending 90 supplies of both strengths.

## 2019-11-23 ENCOUNTER — Telehealth: Payer: Self-pay

## 2019-11-23 NOTE — Telephone Encounter (Signed)
Awaiting PA for both Adderall of 10 & 20mg  Tablets.

## 2019-12-01 NOTE — Telephone Encounter (Signed)
Pt was able to provide new insurance information for me to go ahead and start a new prior auth for both Adderall 10mg  and Adderall 20mg . Will update with information as soon as I am able to.

## 2019-12-02 NOTE — Telephone Encounter (Signed)
*   Your PA has been resolved, no additional PA is required. For further inquiries please contact the number on the back of the member prescription card. KEY: [id number]   * Your PA has been resolved, no additional PA is required. For further inquiries please contact the number on the back of the member prescription card. KEY: [id number]

## 2019-12-13 ENCOUNTER — Telehealth: Payer: Self-pay

## 2019-12-13 MED ORDER — AMPHETAMINE-DEXTROAMPHETAMINE 10 MG PO TABS: 10.0000 mg | ORAL_TABLET | Freq: Every day | ORAL | 0 refills | Status: DC

## 2019-12-13 MED ORDER — AMPHETAMINE-DEXTROAMPHET ER 20 MG PO CP24: 20.0000 mg | ORAL_CAPSULE | ORAL | 0 refills | Status: DC

## 2019-12-13 NOTE — Telephone Encounter (Signed)
Done erx 

## 2019-12-22 ENCOUNTER — Telehealth: Payer: Self-pay

## 2019-12-22 MED ORDER — AMPHETAMINE-DEXTROAMPHETAMINE 10 MG PO TABS: 10.0000 mg | ORAL_TABLET | Freq: Every day | ORAL | 0 refills | Status: AC

## 2019-12-22 MED ORDER — AMPHETAMINE-DEXTROAMPHET ER 20 MG PO CP24: 20.0000 mg | ORAL_CAPSULE | ORAL | 0 refills | Status: AC

## 2019-12-22 NOTE — Telephone Encounter (Signed)
Done erx 

## 2020-04-10 ENCOUNTER — Other Ambulatory Visit: Payer: Self-pay | Admitting: Internal Medicine
